# Patient Record
Sex: Female | Born: 1990 | Race: White | Hispanic: No | Smoking: Former smoker
Health system: Southern US, Community
[De-identification: ages and names within clinical notes are randomized; demographics above are authoritative.]

## PROBLEM LIST (undated history)

## (undated) ENCOUNTER — Inpatient Hospital Stay (HOSPITAL_COMMUNITY): Payer: Self-pay

## (undated) DIAGNOSIS — F32A Depression, unspecified: Secondary | ICD-10-CM

## (undated) DIAGNOSIS — G43909 Migraine, unspecified, not intractable, without status migrainosus: Secondary | ICD-10-CM

## (undated) DIAGNOSIS — G971 Other reaction to spinal and lumbar puncture: Secondary | ICD-10-CM

## (undated) DIAGNOSIS — F329 Major depressive disorder, single episode, unspecified: Secondary | ICD-10-CM

## (undated) DIAGNOSIS — O321XX0 Maternal care for breech presentation, not applicable or unspecified: Secondary | ICD-10-CM

## (undated) HISTORY — PX: ADENOIDECTOMY: SUR15

## (undated) HISTORY — PX: CERCLAGE REMOVAL: SUR1451

## (undated) HISTORY — PX: TONSILLECTOMY: SUR1361

---
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
## 2012-04-10 ENCOUNTER — Ambulatory Visit: Payer: 59 | Admitting: Family Medicine

## 2012-05-12 ENCOUNTER — Emergency Department (HOSPITAL_COMMUNITY)
Admission: EM | Admit: 2012-05-12 | Discharge: 2012-05-12 | Disposition: A | Discharge status: Home / Self Care | Payer: 59 | Attending: Emergency Medicine | Admitting: Emergency Medicine

## 2012-05-12 ENCOUNTER — Ambulatory Visit: Payer: 59 | Admitting: Family Medicine

## 2012-05-12 ENCOUNTER — Encounter (HOSPITAL_COMMUNITY): Payer: Self-pay | Admitting: Emergency Medicine

## 2012-05-12 ENCOUNTER — Emergency Department (HOSPITAL_COMMUNITY): Payer: 59

## 2012-05-12 DIAGNOSIS — R51 Headache: Secondary | ICD-10-CM

## 2012-05-12 DIAGNOSIS — F17200 Nicotine dependence, unspecified, uncomplicated: Secondary | ICD-10-CM | POA: Insufficient documentation

## 2012-05-12 DIAGNOSIS — Z79899 Other long term (current) drug therapy: Secondary | ICD-10-CM | POA: Insufficient documentation

## 2012-05-12 DIAGNOSIS — F329 Major depressive disorder, single episode, unspecified: Secondary | ICD-10-CM | POA: Insufficient documentation

## 2012-05-12 DIAGNOSIS — F3289 Other specified depressive episodes: Secondary | ICD-10-CM | POA: Insufficient documentation

## 2012-05-12 DIAGNOSIS — R1110 Vomiting, unspecified: Secondary | ICD-10-CM | POA: Insufficient documentation

## 2012-05-12 HISTORY — DX: Major depressive disorder, single episode, unspecified: F32.9

## 2012-05-12 HISTORY — DX: Depression, unspecified: F32.A

## 2012-05-12 MED ORDER — DIPHENHYDRAMINE HCL 50 MG/ML IJ SOLN
25.0000 mg | Freq: Once | INTRAMUSCULAR | Status: AC
  Administered 2012-05-12: 25 mg via INTRAVENOUS
  Filled 2012-05-12: qty 1

## 2012-05-12 MED ORDER — SODIUM CHLORIDE 0.9 % IV BOLUS (SEPSIS)
1000.0000 mL | Freq: Once | INTRAVENOUS | Status: AC
  Administered 2012-05-12: 1000 mL via INTRAVENOUS

## 2012-05-12 MED ORDER — PROMETHAZINE HCL 25 MG PO TABS: 25.0000 mg | ORAL_TABLET | Freq: Four times a day (QID) | ORAL | Status: DC | PRN

## 2012-05-12 MED ORDER — HYDROMORPHONE HCL PF 1 MG/ML IJ SOLN: 1.0000 mg | Freq: Once | INTRAMUSCULAR | Status: DC

## 2012-05-12 MED ORDER — KETOROLAC TROMETHAMINE 30 MG/ML IJ SOLN
30.0000 mg | Freq: Once | INTRAMUSCULAR | Status: AC
  Administered 2012-05-12: 30 mg via INTRAVENOUS
  Filled 2012-05-12: qty 1

## 2012-05-12 MED ORDER — METOCLOPRAMIDE HCL 5 MG/ML IJ SOLN
10.0000 mg | Freq: Once | INTRAMUSCULAR | Status: AC
  Administered 2012-05-12: 10 mg via INTRAVENOUS
  Filled 2012-05-12: qty 2

## 2012-05-12 MED ORDER — FENTANYL CITRATE 0.05 MG/ML IJ SOLN
50.0000 ug | Freq: Once | INTRAMUSCULAR | Status: AC
  Administered 2012-05-12: 50 ug via INTRAVENOUS
  Filled 2012-05-12: qty 2

## 2012-05-12 MED ORDER — ONDANSETRON HCL 4 MG/2ML IJ SOLN: 4.0000 mg | Freq: Once | INTRAMUSCULAR | Status: DC

## 2012-05-12 MED ORDER — OXYCODONE-ACETAMINOPHEN 5-325 MG PO TABS: 1.0000 | ORAL_TABLET | Freq: Four times a day (QID) | ORAL | Status: DC | PRN

## 2012-05-12 NOTE — ED Notes (Signed)
Went into patient's room to give visitor a drink, patient asleep at this time.

## 2012-05-12 NOTE — ED Notes (Signed)
Patient stated pain had come back and requested something else for pain, Dr. Adriana Simas notified.

## 2012-05-12 NOTE — ED Provider Notes (Signed)
History     CSN: [id number]  Arrival date & time 05/12/12  0346   First MD Initiated Contact with Patient 05/12/12 0404      Chief Complaint  Patient presents with  . Headache  . Emesis    (Consider location/radiation/quality/duration/timing/severity/associated sxs/prior treatment) HPI.....  right periorbital headache intermittently for one month.  Additionally patient complains of nausea vomiting and photophobia. No meningeal signs. No neuro deficits. Nothing makes symptoms better or worse. Severity is moderate   Past Medical History  Diagnosis Date  . Depression     Past Surgical History  Procedure Date  . Tonsillectomy   . Adenoidectomy   . Cesarean section   . Cerclage removal     History reviewed. No pertinent family history.  History  Substance Use Topics  . Smoking status: Current Every Day Smoker  . Smokeless tobacco: Not on file  . Alcohol Use: No    OB History    Grav Para Term Preterm Abortions TAB SAB Ect Mult Living                  Review of Systems  All other systems reviewed and are negative.    Allergies  Lamictal  Home Medications   Current Outpatient Rx  Name  Route  Sig  Dispense  Refill  . CITALOPRAM HYDROBROMIDE 20 MG PO TABS   Oral   Take 20 mg by mouth 2 (two) times daily.         Marland Kitchen FLUOXETINE HCL 20 MG PO CAPS   Oral   Take 20 mg by mouth daily.             BP 131/73  Pulse 87  Temp 98.1 F (36.7 C) (Oral)  Resp 16  Ht 5\' 7"  (1.702 m)  Wt 190 lb (86.183 kg)  BMI 29.76 kg/m2  SpO2 98%  Physical Exam  Nursing note and vitals reviewed. Constitutional: She is oriented to person, place, and time. She appears well-developed and well-nourished.  HENT:  Head: Normocephalic and atraumatic.  Eyes: Conjunctivae normal and EOM are normal. Pupils are equal, round, and reactive to light.  Neck: Normal range of motion. Neck supple.  Cardiovascular: Normal rate, regular rhythm and normal heart sounds.     Pulmonary/Chest: Effort normal and breath sounds normal.  Abdominal: Soft. Bowel sounds are normal.  Musculoskeletal: Normal range of motion.  Neurological: She is alert and oriented to person, place, and time.  Skin: Skin is warm and dry.  Psychiatric: She has a normal mood and affect.    ED Course  Procedures (including critical care time)  Labs Reviewed - No data to display No results found.   No diagnosis found.  Ct Head Wo Contrast  05/12/2012  *RADIOLOGY REPORT*  Clinical Data: Long-standing headache; nausea and vomiting. Sensitivity to light.  CT HEAD WITHOUT CONTRAST  Technique:  Contiguous axial images were obtained from the base of the skull through the vertex without contrast.  Comparison: None.  Findings: There is no evidence of acute infarction, mass lesion, or intra- or extra-axial hemorrhage on CT.  The posterior fossa, including the cerebellum, brainstem and fourth ventricle, is within normal limits.  The third and lateral ventricles, and basal ganglia are unremarkable in appearance.  The cerebral hemispheres are symmetric in appearance, with normal gray- white differentiation.  No mass effect or midline shift is seen.  There is no evidence of fracture; visualized osseous structures are unremarkable in appearance.  The visualized portions of the orbits  are within normal limits.  The paranasal sinuses and mastoid air cells are well-aerated.  No significant soft tissue abnormalities are seen.  IMPRESSION: Unremarkable noncontrast CT of the head.   Original Report Authenticated By: Tonia Ghent, M.D.     MDM   Patient feeling better after IV fluids, IV Toradol, Reglan, Benadryl.  CT head negative       Donnetta Hutching, MD 05/12/12 [phone]

## 2012-05-12 NOTE — ED Notes (Signed)
Patient reports headache that started tonight. Reports frequent headaches over the past month. Also complaining of nausea, emesis, and sensitivity to light and sound.

## 2012-05-17 ENCOUNTER — Telehealth: Payer: Self-pay | Admitting: Family Medicine

## 2012-05-17 NOTE — Telephone Encounter (Signed)
Pt's mother called and says pt is having more frequent migraines. She has a new pt apptmt on June 15, 2012, but wants to see if she can get in sooner. Is this possible? Thank you.

## 2012-05-17 NOTE — Telephone Encounter (Signed)
Rescheduled her for Monday, Nov. 18th at 2:45 p.m. Thank you.

## 2012-05-17 NOTE — Telephone Encounter (Signed)
Yes ok to see sooner- not last appt of day please.

## 2012-05-22 ENCOUNTER — Encounter: Payer: Self-pay | Admitting: Family Medicine

## 2012-05-22 ENCOUNTER — Ambulatory Visit (INDEPENDENT_AMBULATORY_CARE_PROVIDER_SITE_OTHER): Payer: 59 | Admitting: Family Medicine

## 2012-05-22 VITALS — BP 120/88 | HR 84 | Temp 97.9°F | Ht 67.0 in | Wt 212.0 lb

## 2012-05-22 DIAGNOSIS — F1921 Other psychoactive substance dependence, in remission: Secondary | ICD-10-CM | POA: Insufficient documentation

## 2012-05-22 DIAGNOSIS — Z915 Personal history of self-harm: Secondary | ICD-10-CM | POA: Insufficient documentation

## 2012-05-22 DIAGNOSIS — G43909 Migraine, unspecified, not intractable, without status migrainosus: Secondary | ICD-10-CM

## 2012-05-22 DIAGNOSIS — F411 Generalized anxiety disorder: Secondary | ICD-10-CM

## 2012-05-22 DIAGNOSIS — F419 Anxiety disorder, unspecified: Secondary | ICD-10-CM

## 2012-05-22 MED ORDER — ETONOGESTREL 68 MG ~~LOC~~ IMPL: 1.0000 | DRUG_IMPLANT | Freq: Once | SUBCUTANEOUS | Status: DC

## 2012-05-22 MED ORDER — TOPIRAMATE 25 MG PO TABS: 25.0000 mg | ORAL_TABLET | Freq: Two times a day (BID) | ORAL | Status: DC

## 2012-05-22 MED ORDER — PROMETHAZINE HCL 25 MG PO TABS: 25.0000 mg | ORAL_TABLET | Freq: Four times a day (QID) | ORAL | Status: DC | PRN

## 2012-05-22 NOTE — Patient Instructions (Addendum)
Great to see you. We are topamax 25 mg nightly- please call me a few weeks with an update.  Please stop by to see Shirlee Limerick on your way out to set up you psychiatry appointment.

## 2012-05-22 NOTE — Progress Notes (Signed)
Subjective:    Patient ID: Dana Simpson, female    DOB: 08-Dec-1990, 21 y.o.   MRN: [id number]  HPI Very pleasant 21 yo G3P2 here to establish care.  Anxiety- h/o anxiety and depression.  Did have a suicide attempt after she miscarried her child a few years ago. 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 Currently taking Celexa 20 mg twice daily which she thinks is working pretty well but still has bouts of anxiety and depression. Her boyfriend is in prison but she lives with her mom who helps care for her child. 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  She had multiple intolerances to several antidepressants and anxiolytics. 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  Does not feel depressed currently.  Migraine headaches- having at least 5 per week. Associated with photophobia, nausea and vomiting. No other neurological symptoms.  Usually unilateral but alternates sides.  Has Implanon for contraception and not currently sexually active.  Patient Active Problem List  Diagnosis  . TOBACCO ABUSE  . VAGINAL DISCHARGE  . ABORTION, SPONTANEOUS  . RH FACTOR, NEGATIVE  . PREGNANCY, ADOLESCENT  . BACK PAIN  . History of drug dependence  . Anxiety  . Migraine headache  . History of suicide attempt   Past Medical History  Diagnosis Date  . Depression    Past Surgical History  Procedure Date  . Tonsillectomy   . Adenoidectomy   . Cesarean section   . Cerclage removal    History  Substance Use Topics  . Smoking status: Current Every Day Smoker  . Smokeless tobacco: Not on file  . Alcohol Use: No   No family history on file. Allergies  Allergen Reactions  . Lamictal (Lamotrigine) Rash    Trudie Buckler syndrome    Current Outpatient Prescriptions on File Prior to Visit  Medication Sig Dispense Refill  . citalopram (CELEXA) 20 MG  tablet Take 20 mg by mouth 2 (two) times daily.      Marland Kitchen oxyCODONE-acetaminophen (PERCOCET/ROXICET) 5-325 MG per tablet Take 1-2 tablets by mouth every 6 (six) hours as needed for pain.  20 tablet  0  . [DISCONTINUED] promethazine (PHENERGAN) 25 MG tablet Take 1 tablet (25 mg total) by mouth every 6 (six) hours as needed for nausea.  20 tablet  0  . etonogestrel (IMPLANON) 68 MG IMPL implant Inject 1 each (68 mg total) into the skin once.  1 each  0  . topiramate (TOPAMAX) 25 MG tablet Take 1 tablet (25 mg total) by mouth 2 (two) times daily.  30 tablet  1   The PMH, PSH, Social History, Family History, Medications, and allergies have been reviewed in [facility], and have been updated if relevant.    Review of Systems See HPI    Objective:   Physical Exam  BP 120/88  Pulse 84  Temp 97.9 F (36.6 C)  Ht 5\' 7"  (1.702 m)  Wt 212 lb (96.163 kg)  BMI 33.20 kg/m2  General:  Well-developed,well-nourished,in no acute distress; alert,appropriate and cooperative throughout examination Neurologic:  alert & oriented X3 and gait normal.   Skin:  Intact without suspicious lesions or rashes Psych:  Cognition and judgment appear intact. Alert and cooperative with normal attention span and concentration. No apparent delusions, illusions, hallucinations       Assessment & Plan:  1. Anxiety  >25 min spent with face to face with patient, >50% counseling and/or coordinating care. Given multiple drug intolerances and allergy (severe) along with h/o substance abuse and suicide attempt, will refer to psychiatry for management. Currently feels celexa is working "pretty well."   Ambulatory referral to Psychiatry  2. Migraine headache  Deteriorated.  Start Topamax 25 mg qhs for prophylaxis.  Discussed headache journal and rebound headaches. She is aware that it is not safe to take Topamax during pregnancy but she does have implanon and is not currently sexually active.  I advised using condoms for double  protection if she does become sexually active. The patient indicates understanding of these issues and agrees with the plan.

## 2012-05-27 ENCOUNTER — Emergency Department (HOSPITAL_COMMUNITY)
Admission: EM | Admit: 2012-05-27 | Discharge: 2012-05-27 | Disposition: A | Discharge status: Home / Self Care | Payer: 59 | Attending: Emergency Medicine | Admitting: Emergency Medicine

## 2012-05-27 ENCOUNTER — Encounter (HOSPITAL_COMMUNITY): Payer: Self-pay | Admitting: *Deleted

## 2012-05-27 DIAGNOSIS — G4700 Insomnia, unspecified: Secondary | ICD-10-CM | POA: Insufficient documentation

## 2012-05-27 DIAGNOSIS — M542 Cervicalgia: Secondary | ICD-10-CM

## 2012-05-27 DIAGNOSIS — Z79899 Other long term (current) drug therapy: Secondary | ICD-10-CM | POA: Insufficient documentation

## 2012-05-27 DIAGNOSIS — R209 Unspecified disturbances of skin sensation: Secondary | ICD-10-CM | POA: Insufficient documentation

## 2012-05-27 DIAGNOSIS — F3289 Other specified depressive episodes: Secondary | ICD-10-CM | POA: Insufficient documentation

## 2012-05-27 DIAGNOSIS — F329 Major depressive disorder, single episode, unspecified: Secondary | ICD-10-CM | POA: Insufficient documentation

## 2012-05-27 DIAGNOSIS — F17200 Nicotine dependence, unspecified, uncomplicated: Secondary | ICD-10-CM | POA: Insufficient documentation

## 2012-05-27 DIAGNOSIS — R112 Nausea with vomiting, unspecified: Secondary | ICD-10-CM | POA: Insufficient documentation

## 2012-05-27 DIAGNOSIS — R0789 Other chest pain: Secondary | ICD-10-CM | POA: Insufficient documentation

## 2012-05-27 DIAGNOSIS — G43909 Migraine, unspecified, not intractable, without status migrainosus: Secondary | ICD-10-CM | POA: Insufficient documentation

## 2012-05-27 HISTORY — DX: Migraine, unspecified, not intractable, without status migrainosus: G43.909

## 2012-05-27 MED ORDER — IBUPROFEN 800 MG PO TABS
800.0000 mg | ORAL_TABLET | Freq: Once | ORAL | Status: AC
  Administered 2012-05-27: 800 mg via ORAL
  Filled 2012-05-27: qty 1

## 2012-05-27 MED ORDER — CYCLOBENZAPRINE HCL 10 MG PO TABS
10.0000 mg | ORAL_TABLET | Freq: Once | ORAL | Status: AC
  Administered 2012-05-27: 10 mg via ORAL
  Filled 2012-05-27: qty 1

## 2012-05-27 MED ORDER — IBUPROFEN 600 MG PO TABS: 600.0000 mg | ORAL_TABLET | Freq: Four times a day (QID) | ORAL | Status: DC | PRN

## 2012-05-27 MED ORDER — CYCLOBENZAPRINE HCL 10 MG PO TABS: 10.0000 mg | ORAL_TABLET | Freq: Two times a day (BID) | ORAL | Status: DC | PRN

## 2012-05-27 NOTE — ED Provider Notes (Signed)
History  This chart was scribed for Jones Skene, MD by Erskine Emery, ED Scribe. This patient was seen in room APA14/APA14 and the patient's care was started at 13:52.   CSN: [id number]  Arrival date & time 05/27/12  1337   First MD Initiated Contact with Patient 05/27/12 1352      Chief Complaint  Patient presents with  . Headache  . Numbness    (Consider location/radiation/quality/duration/timing/severity/associated sxs/prior Treatment) The history is provided by the patient. No language interpreter was used.  Dana Simpson is a 21 y.o. female who presents to the Emergency Department complaining of back of the neck pain of a 9/10 severity for weeks. Pt reports she was here a few months ago for migraines and the pain has since moved down to her neck. Pt reports some associated insomnia, pain behind the eyes, and pressure in her ears. Pt usually has some nausea and emesis during her migraine episodes, but she has not had any today. Pt reports rotating her head aggravates the pain. Pt denies any associated fevers, chills, sinus pain or sinus pressure, SOB, cough, coughing up blood, emesis, rhinorrhea, sore throat, shoulder pain, leg pain, ankle swelling, diarrhea, abdominal pain, or blood in her stools. Pt also reports some  intermittent tightening chest pain since her last visit here. This pain is worst upon waking, and usually of about a 6/10 severity. Pt had a child 4-5 months ago and she spends her days taking care of her child and not exercising.  Pt was given a CT scan during her last visit, but she has not had an MRI. She was discharged with hydrocodone and phenergan, which she took in addition to her routine Celexa. Pt was recently seen by her PCP for the same complaint and was prescribed Topamax, which mildly relieves her symptoms. Pt had a Cerclage in January, a spinal tap duiring her recent cesarian section, and a tonsillectomy several years ago. Pt has a family h/o DDD, chronic back  pain, and stroke, but nt family h/o aneurism. Pt smokes about 4-5 cigarettes/day but she doesn't drink or take any other recreational drugs.  Past Medical History  Diagnosis Date  . Depression   . Migraines     Past Surgical History  Procedure Date  . Tonsillectomy   . Adenoidectomy   . Cesarean section   . Cerclage removal     No family history on file.  History  Substance Use Topics  . Smoking status: Current Every Day Smoker  . Smokeless tobacco: Not on file  . Alcohol Use: No    OB History    Grav Para Term Preterm Abortions TAB SAB Ect Mult Living                  Review of Systems At least 10pt or greater review of systems completed and are negative except where specified in the HPI.  Allergies  Lamictal  Home Medications   Current Outpatient Rx  Name  Route  Sig  Dispense  Refill  . CITALOPRAM HYDROBROMIDE 20 MG PO TABS   Oral   Take 20 mg by mouth 2 (two) times daily.         . ETONOGESTREL 68 MG [place] IMPL   Subcutaneous   Inject 1 each (68 mg total) into the skin once.   1 each   0   . OXYCODONE-ACETAMINOPHEN 5-325 MG PO TABS   Oral   Take 1-2 tablets by mouth every 6 (six) hours as needed  for pain.   20 tablet   0   . PROMETHAZINE HCL 25 MG PO TABS   Oral   Take 1 tablet (25 mg total) by mouth every 6 (six) hours as needed for nausea.   30 tablet   0   . TOPIRAMATE 25 MG PO TABS   Oral   Take 1 tablet (25 mg total) by mouth 2 (two) times daily.   30 tablet   1     Triage Vitals: BP 120/82  Pulse 101  Temp 97.7 F (36.5 C) (Oral)  Resp 18  Ht 5\' 7"  (1.702 m)  Wt 212 lb (96.163 kg)  BMI 33.20 kg/m2  SpO2 99%  Physical Exam  PHYSICAL EXAM: VITAL SIGNS:  . Filed Vitals:   05/27/12 1341  BP: 120/82  Pulse: 101  Temp: 97.7 F (36.5 C)  TempSrc: Oral  Resp: 18  Height: 5\' 7"  (1.702 m)  Weight: 212 lb (96.163 kg)  SpO2: 99%   CONSTITUTIONAL: Awake, oriented, appears non-toxic HENT: Atraumatic, normocephalic, oral  mucosa pink and moist, airway patent. Nares patent without drainage. External ears normal. EYES: Conjunctiva clear, EOMI, PERRLA NECK: Trachea midline, non-tender, supple CARDIOVASCULAR: Normal heart rate, Normal rhythm, No murmurs, rubs, gallops PULMONARY/CHEST: Clear to auscultation, no rhonchi, wheezes, or rales. Symmetrical breath sounds. CHEST WALL: No lesions. Non-tender. ABDOMINAL: Non-distended, obese, soft, non-tender - no rebound or guarding.  BS normal. NEUROLOGIC: UJ:[id number] fields intact. PERRLA, EOMI.  Facial sensation equal to light touch bilaterally.  Good muscle bulk in the masseter muscle and good lateral movement of the jaw.  Facial expressions equal and good strength with smile/frown and puffed cheeks.  Hearing grossly intact to finger rub test.  Uvula, tongue are midline with no deviation. Symmetrical palate elevation.  Trapezius and SCM muscles are 5/5 strength bilaterally.   DTR: Brachioradialis, biceps, patellar, Achilles tendon reflexes 2+ bilaterally.  No clonus. Strength: 5/5 strength flexors and extensors in the upper and lower extremities.  Grip strength, finger adduction/abduction 5/5. Sensation: Sensation intact distally to light touch Cerebellar: No ataxia with walking or dysmetria with finger to nose, rapid alternating hand movements and heels to shin testing. EXTREMITIES: No clubbing, cyanosis, or edema SKIN: Warm, Dry, No erythema, No rash   ED Course  Procedures (including critical care time) DIAGNOSTIC STUDIES: Oxygen Saturation is 99% on room air, normal by my interpretation.    COORDINATION OF CARE: 14:05--I evaluated the patient and we discussed a treatment plan including pain medication to which the pt agreed.   14:30--Medication orders: Cyclobenzaprine (Flexeril) tablet 10 mg--once    Ibuprofen (Advil, Motrin) tablet 800 mg--once    Labs Reviewed - No data to display No results found.   1. Neck pain   2. Cervical muscle pain       MDM    Dana Simpson is a 21 y.o. female presenting with neck pain. Patient had recurrent migraine headaches and has been treated with double max for those, has had no recurrent migraine since being put on this medicine. However this week, received any new pillow top for her bed and has since had neck problems. Patient is concerned that her migraine headaches have migrated down into her neck however think this is a simple musculoskeletal problem. Treat the patient with ibuprofen and one cyclobenzaprine here, we'll reevaluate. I think her mother was concerned that the patient would need an MRI because her mother, the patient's grandmother, had a debilitating stroke at a young age. Patient has no signs or  symptoms which are concerning no focal neurologic deficits, I do not think she's got a subarachnoid hemorrhage-there is no headache present, there was a prior CT done of her head showing no mass effect, no hydrocephalus.  After treatment, patient's pain is nonexistent, given her some exercises to perform at home, neck circles, we'll prescribe ibuprofen for her as well as cyclobenzaprine to use as needed. Have also advised her to take the pillow top her off of her bed.  I explained the diagnosis and have given explicit precautions to return to the ER including numbness, tingling, weakness in the upper extremities or any other new or worsening symptoms. The patient understands and accepts the medical plan as it's been dictated and I have answered their questions. Discharge instructions concerning home care and prescriptions have been given.  The patient is STABLE and is discharged to home in good condition.       I personally performed the services described in this documentation, which was scribed in my presence. The recorded information has been reviewed and is accurate. Jones Skene, M.D.     Jones Skene, MD 05/27/12 1738

## 2012-05-27 NOTE — ED Notes (Signed)
Ongoing headaches since last visit. Currently seeing PMD for same. Pt states pain has moved down to back of her neck. Bilateral ear pain. Mother states pt was unable to complete a sentence last night and c/o right sided numbness.

## 2012-06-15 ENCOUNTER — Ambulatory Visit: Payer: 59 | Admitting: Family Medicine

## 2012-07-03 ENCOUNTER — Ambulatory Visit: Payer: 59 | Admitting: Family Medicine

## 2012-07-07 ENCOUNTER — Encounter: Payer: Self-pay | Admitting: Family Medicine

## 2012-07-07 ENCOUNTER — Ambulatory Visit (INDEPENDENT_AMBULATORY_CARE_PROVIDER_SITE_OTHER): Payer: 59 | Admitting: Family Medicine

## 2012-07-07 VITALS — BP 120/70 | HR 84 | Temp 98.1°F | Wt 221.0 lb

## 2012-07-07 DIAGNOSIS — Z23 Encounter for immunization: Secondary | ICD-10-CM

## 2012-07-07 DIAGNOSIS — Z915 Personal history of self-harm: Secondary | ICD-10-CM

## 2012-07-07 DIAGNOSIS — Z8659 Personal history of other mental and behavioral disorders: Secondary | ICD-10-CM

## 2012-07-07 DIAGNOSIS — F419 Anxiety disorder, unspecified: Secondary | ICD-10-CM

## 2012-07-07 DIAGNOSIS — F411 Generalized anxiety disorder: Secondary | ICD-10-CM

## 2012-07-07 MED ORDER — ALPRAZOLAM 0.25 MG PO TABS: 0.2500 mg | ORAL_TABLET | Freq: Three times a day (TID) | ORAL | Status: DC | PRN

## 2012-07-07 NOTE — Progress Notes (Signed)
Subjective:    Patient ID: Dana Simpson, female    DOB: Jul 01, 1991, 22 y.o.   MRN: [id number]  HPI Very pleasant 22 yo here to discuss anxiety.  Anxiety- h/o anxiety and depression.    Did have a suicide attempt after she miscarried her child a few years ago.  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  Currently taking Celexa 20 mg twice daily which she thinks is working pretty well but still has bouts of anxiety and depression. Her boyfriend is in prison but she lives with her mom who helps care for her child. 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  She is here with her friend today who is very supportive and willing to help out with her baby.  She had multiple intolerances to several antidepressants and anxiolytics. 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  When she established care with me in 05/2012, I referred her to psychiatry for management given her multiple drug intolerances and allergy (severe) along with h/o substance abuse and suicide attempt. She tells me today that when she called around, she could not afford to pay for a psychiatrist. .  Patient Active Problem List  Diagnosis  . TOBACCO ABUSE  . VAGINAL DISCHARGE  . ABORTION, SPONTANEOUS  . RH FACTOR, NEGATIVE  . PREGNANCY, ADOLESCENT  . BACK PAIN  . History of drug dependence  . Anxiety  . Migraine headache  . History of suicide attempt   Past Medical History  Diagnosis Date  . Depression   . Migraines    Past Surgical History  Procedure Date  . Tonsillectomy   . Adenoidectomy   . Cesarean section   . Cerclage removal    History  Substance Use Topics  . Smoking status: Current Every Day Smoker  . Smokeless tobacco: Not on file  . Alcohol Use: No   No family history on file. Allergies  Allergen Reactions  .  Lamictal (Lamotrigine) Rash    Trudie Buckler syndrome    Current Outpatient Prescriptions on File Prior to Visit  Medication Sig Dispense Refill  . citalopram (CELEXA) 20 MG tablet Take 20 mg by mouth 2 (two) times daily.      . cyclobenzaprine (FLEXERIL) 10 MG tablet Take 1 tablet (10 mg total) by mouth 2 (two) times daily as needed for muscle spasms.  20 tablet  0  . etonogestrel (IMPLANON) 68 MG IMPL implant Inject 1 each (68 mg total) into the skin once.  1 each  0  . ibuprofen (ADVIL,MOTRIN) 600 MG tablet Take 1 tablet (600 mg total) by mouth every 6 (six) hours as needed for pain.  30 tablet  0  . oxyCODONE-acetaminophen (PERCOCET/ROXICET) 5-325 MG per tablet Take 1-2 tablets by mouth every 6 (six) hours as needed for pain.  20 tablet  0  . promethazine (PHENERGAN) 25 MG tablet Take 1 tablet (25 mg total) by mouth every 6 (six) hours as needed for nausea.  30 tablet  0  . topiramate (TOPAMAX) 25 MG tablet Take 1 tablet (25 mg total) by mouth 2 (two) times daily.  30 tablet  1   The PMH, PSH, Social History, Family History, Medications, and allergies have been reviewed in [facility], and have been updated if relevant.    Review of  Systems See HPI    Objective:   Physical Exam BP 120/70  Pulse 84  Temp 98.1 F (36.7 C)  Wt 221 lb (100.245 kg)  General:  Well-developed,well-nourished,in no acute distress; alert,appropriate and cooperative throughout examination Neurologic:  alert & oriented X3 and gait normal.   Skin:  Intact without suspicious lesions or rashes Psych:  Cognition and judgment appear intact. Alert and cooperative with normal attention span and concentration. No apparent delusions, illusions, hallucinations       Assessment & Plan:   1. Anxiety  >25 min spent with face to face with patient, >50% counseling and/or coordinating care. She asks for xanax today, which I denied due to her history of addiction.  Will refer to Dr. Laymond Purser for psychotherapy as hopefully  helping her come up with a plan that will allow her to cope with the stress and get out of the house to do things will be more beneficial than medication.  Continue celexa.   The patient indicates understanding of these issues and agrees with the plan.    Orders Placed This Encounter  Procedures  . Flu vaccine greater than or equal to 3yo preservative free IM  . Ambulatory referral to Psychology

## 2012-07-07 NOTE — Patient Instructions (Addendum)
Good to see you. Please stop by to see [name] on your way out to set up your appointment with Dr. Perrin. 

## 2012-07-12 ENCOUNTER — Ambulatory Visit: Payer: 59 | Admitting: Psychology

## 2012-07-18 ENCOUNTER — Other Ambulatory Visit: Payer: Self-pay | Admitting: Family Medicine

## 2012-07-19 ENCOUNTER — Other Ambulatory Visit: Payer: Self-pay | Admitting: Family Medicine

## 2012-07-19 NOTE — Telephone Encounter (Signed)
Toniann Fail pts mother called to ck status of topamax refill.spoke with Meisha at [facility] and med ready for pick up. Toniann Fail notified.

## 2012-08-11 ENCOUNTER — Encounter: Payer: Self-pay | Admitting: Family Medicine

## 2012-08-11 ENCOUNTER — Ambulatory Visit (INDEPENDENT_AMBULATORY_CARE_PROVIDER_SITE_OTHER): Payer: 59 | Admitting: Family Medicine

## 2012-08-11 VITALS — BP 110/68 | HR 113 | Temp 98.8°F | Wt 219.0 lb

## 2012-08-11 DIAGNOSIS — F411 Generalized anxiety disorder: Secondary | ICD-10-CM

## 2012-08-11 DIAGNOSIS — L309 Dermatitis, unspecified: Secondary | ICD-10-CM | POA: Insufficient documentation

## 2012-08-11 DIAGNOSIS — F419 Anxiety disorder, unspecified: Secondary | ICD-10-CM

## 2012-08-11 DIAGNOSIS — L259 Unspecified contact dermatitis, unspecified cause: Secondary | ICD-10-CM

## 2012-08-11 MED ORDER — ZOLPIDEM TARTRATE 5 MG PO TABS: 5.0000 mg | ORAL_TABLET | Freq: Every evening | ORAL | Status: DC | PRN

## 2012-08-11 MED ORDER — TOPIRAMATE 25 MG PO TABS: ORAL_TABLET | ORAL | Status: DC

## 2012-08-11 MED ORDER — FLUOCINONIDE-E 0.05 % EX CREA: TOPICAL_CREAM | Freq: Two times a day (BID) | CUTANEOUS | Status: DC

## 2012-08-11 MED ORDER — PROMETHAZINE HCL 25 MG PO TABS: 25.0000 mg | ORAL_TABLET | Freq: Four times a day (QID) | ORAL | Status: DC | PRN

## 2012-08-11 NOTE — Progress Notes (Signed)
Subjective:    Patient ID: Dana Simpson, female    DOB: 11-Apr-1991, 22 y.o.   MRN: [id number]  HPI Very pleasant 22 yo female here with her mom to follow up anxiety.  Anxiety- h/o anxiety and depression.    Did have a suicide attempt after she miscarried her child a few years ago.  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  Currently taking Celexa 20 mg twice daily which she thinks is working pretty well but still has bouts of anxiety and depression.  I also referred her to see Dr. Laymond Purser last month.  She has not seen her yet- pt had to reschedule multiple times.  Her boyfriend is in prison but she lives with her mom who helps care for her child. 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  She had multiple intolerances to several antidepressants and anxiolytics. 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  When she established care with me in 05/2012, I referred her to psychiatry for management given her multiple drug intolerances and allergy (severe) along with h/o substance abuse and suicide attempt. She never made that appointment.  She is having panic attacks at night and cannot sleep.  Also has had dry skin on her knuckles for past several weeks. .  Patient Active Problem List  Diagnosis  . TOBACCO ABUSE  . VAGINAL DISCHARGE  . ABORTION, SPONTANEOUS  . RH FACTOR, NEGATIVE  . PREGNANCY, ADOLESCENT  . BACK PAIN  . History of drug dependence  . Anxiety  . Migraine headache  . History of suicide attempt   Past Medical History  Diagnosis Date  . Depression   . Migraines    Past Surgical History  Procedure Date  . Tonsillectomy   . Adenoidectomy   . Cesarean section   . Cerclage removal    History  Substance Use Topics  . Smoking status: Current Every Day Smoker  . Smokeless tobacco:  Not on file  . Alcohol Use: No   No family history on file. Allergies  Allergen Reactions  . Lamictal (Lamotrigine) Rash    Trudie Buckler syndrome    Current Outpatient Prescriptions on File Prior to Visit  Medication Sig Dispense Refill  . citalopram (CELEXA) 20 MG tablet Take 20 mg by mouth 2 (two) times daily.      Marland Kitchen etonogestrel (IMPLANON) 68 MG IMPL implant Inject 1 each (68 mg total) into the skin once.  1 each  0  . ibuprofen (ADVIL,MOTRIN) 600 MG tablet Take 1 tablet (600 mg total) by mouth every 6 (six) hours as needed for pain.  30 tablet  0  . promethazine (PHENERGAN) 25 MG tablet Take 1 tablet (25 mg total) by mouth every 6 (six) hours as needed for nausea.  30 tablet  0  . topiramate (TOPAMAX) 25 MG tablet TAKE ONE TABLET BY MOUTH TWICE DAILY  30 tablet  0  . topiramate (TOPAMAX) 25 MG tablet TAKE ONE TABLET BY MOUTH TWICE DAILY  30 tablet  0   The PMH, PSH, Social History, Family History, Medications, and allergies have been reviewed in [facility], and have been updated if relevant.    Review of Systems See HPI    Objective:   Physical Exam BP 110/68  Pulse 113  Temp 98.8 F (  37.1 C)  Wt 219 lb (99.338 kg)  SpO2 96%  General:  Well-developed,well-nourished,in no acute distress; alert,appropriate and cooperative throughout examination Neurologic:  alert & oriented X3 and gait normal.   Skin:  Dry erythematous skin on knuckles Psych:  Cognition and judgment appear intact. Alert and cooperative with normal attention span and concentration. No apparent delusions, illusions, hallucinations       Assessment & Plan:   1. Anxiety  >25 min spent with face to face with patient, >50% counseling and/or coordinating care. She asks for xanax again today, which I denied due to her history of addiction.  I strongly urged her to keep her appointment with Dr. Laymond Purser and to see a psychiatrist to manage her medication given her multiple allergies and intolerances. Continue celexa.    The patient indicates understanding of these issues and agrees with the plan.        2. Eczema    New- lidex twice daily as needed.  Keep skin moisturized.

## 2012-08-11 NOTE — Patient Instructions (Addendum)
Good to see you. Please take ambien as directed.  Call the psychiatrist like we discussed today and keep me update.

## 2012-08-15 ENCOUNTER — Ambulatory Visit: Payer: 59 | Admitting: Psychology

## 2012-09-06 ENCOUNTER — Encounter: Payer: Self-pay | Admitting: Family Medicine

## 2012-09-06 ENCOUNTER — Ambulatory Visit (INDEPENDENT_AMBULATORY_CARE_PROVIDER_SITE_OTHER): Payer: 59 | Admitting: Family Medicine

## 2012-09-06 VITALS — BP 100/66 | HR 95 | Temp 99.2°F | Ht 67.0 in | Wt 222.8 lb

## 2012-09-06 DIAGNOSIS — R079 Chest pain, unspecified: Secondary | ICD-10-CM

## 2012-09-06 DIAGNOSIS — J209 Acute bronchitis, unspecified: Secondary | ICD-10-CM

## 2012-09-06 MED ORDER — AZITHROMYCIN 250 MG PO TABS: ORAL_TABLET | ORAL | Status: DC

## 2012-09-06 MED ORDER — ONDANSETRON HCL 4 MG PO TABS: 4.0000 mg | ORAL_TABLET | Freq: Three times a day (TID) | ORAL | Status: DC | PRN

## 2012-09-06 NOTE — Patient Instructions (Addendum)
Take the zithromax as directed and think about quitting smoking  Try mucinex DM - for cough and congestion  If you start wheezing- let us know  Drink lots of fluid and get extra rest  Update if not starting to improve in a week or if worsening

## 2012-09-06 NOTE — Progress Notes (Signed)
Subjective:    Patient ID: Dana Simpson, female    DOB: 09/07/1990, 22 y.o.   MRN: [id number]  HPI Here with uri symptoms - since last Sunday  Daughter gave it to her  Runny and stuffy nose and scratchy throat  ? If fever at home - has 99.2 now Has felt very hot - and achey yesterday Did get a flu shot   Is a smoker No copd known and does not wheeze  She wants to quit    Also some chest pains going on for "weeks"  Is sharp and up in R side - and occ radiates to her arm  Is constant  Worse when she picks up the baby or lies on that side It also may be stress related -- also has panic attacks (a little better)  Not exertional at all- happens at rest   Patient Active Problem List  Diagnosis  . TOBACCO ABUSE  . VAGINAL DISCHARGE  . ABORTION, SPONTANEOUS  . RH FACTOR, NEGATIVE  . PREGNANCY, ADOLESCENT  . BACK PAIN  . History of drug dependence  . Anxiety  . Migraine headache  . History of suicide attempt  . Eczema   Past Medical History  Diagnosis Date  . Depression   . Migraines    Past Surgical History  Procedure Laterality Date  . Tonsillectomy    . Adenoidectomy    . Cesarean section    . Cerclage removal     History  Substance Use Topics  . Smoking status: Current Every Day Smoker -- 0.25 packs/day    Types: Cigarettes  . Smokeless tobacco: Not on file  . Alcohol Use: No   No family history on file. Allergies  Allergen Reactions  . Lamictal (Lamotrigine) Rash    Trudie Buckler syndrome    Current Outpatient Prescriptions on File Prior to Visit  Medication Sig Dispense Refill  . citalopram (CELEXA) 20 MG tablet Take 20 mg by mouth 2 (two) times daily.      Marland Kitchen etonogestrel (IMPLANON) 68 MG IMPL implant Inject 1 each (68 mg total) into the skin once.  1 each  0  . fluocinonide-emollient (LIDEX-E) 0.05 % cream Apply topically 2 (two) times daily.  30 g  0  . ibuprofen (ADVIL,MOTRIN) 600 MG tablet Take 1 tablet (600 mg total) by mouth every 6 (six)  hours as needed for pain.  30 tablet  0  . promethazine (PHENERGAN) 25 MG tablet Take 1 tablet (25 mg total) by mouth every 6 (six) hours as needed for nausea.  30 tablet  0  . topiramate (TOPAMAX) 25 MG tablet TAKE ONE TABLET BY MOUTH TWICE DAILY  30 tablet  0  . zolpidem (AMBIEN) 5 MG tablet Take 1 tablet (5 mg total) by mouth at bedtime as needed for sleep.  15 tablet  1   No current facility-administered medications on file prior to visit.       Review of Systems Review of Systems  Constitutional: Negative for appetite change,  and unexpected weight change.  ENT pos for cong and rhinorrhea and neg for facial pain  Eyes: Negative for pain and visual disturbance.  Respiratory: Negative for shortness of breath.   Cardiovascular: Negative for palpitations   neg for PND or orthopnea or exertional cp Gastrointestinal: Negative for  diarrhea and constipation. pos for nausea from post nasal drip (requests zofran for this) Genitourinary: Negative for urgency and frequency.  Skin: Negative for pallor or rash   Neurological: Negative for  weakness, light-headedness, numbness and headaches.  Hematological: Negative for adenopathy. Does not bruise/bleed easily.  Psychiatric/Behavioral: pos for dysphoric mood and anxiety        Objective:   Physical Exam  Constitutional: She appears well-developed and well-nourished. No distress.  Obese fatigued appearing female who smells strongly of smoke  HENT:  Head: Normocephalic and atraumatic.  Right Ear: External ear normal.  Left Ear: External ear normal.  Mouth/Throat: Oropharynx is clear and moist. No oropharyngeal exudate.  Nares are injected and congested  No facial tenderness Throat- clear post nasal drip  Eyes: Conjunctivae are normal. Pupils are equal, round, and reactive to light. Left eye exhibits no discharge.  Neck: Normal range of motion. Neck supple. No JVD present. No thyromegaly present.  Cardiovascular: Normal rate and intact  distal pulses.  Exam reveals no gallop.   Pulmonary/Chest: Effort normal and breath sounds normal. No respiratory distress. She has no wheezes. She has no rales. She exhibits no tenderness.  Harsh and distant bs Few rhonchi  Abdominal: Soft. Bowel sounds are normal. She exhibits no distension and no mass. There is no tenderness.  Musculoskeletal: She exhibits no edema and no tenderness.  No swelling or palp cords Neg homans sign  Lymphadenopathy:    She has no cervical adenopathy.  Neurological: She is alert. She has normal reflexes.  Skin: Skin is warm and dry. No rash noted. No pallor.  Psychiatric: Her affect is blunt. She is slowed and withdrawn. She exhibits a depressed mood.          Assessment & Plan:

## 2012-09-07 NOTE — Assessment & Plan Note (Signed)
In smoker with prod cough and fever  Cover with zithromax inst to quit smoking Disc symptomatic care - see instructions on AVS  Update if not starting to improve in a week or if worsening

## 2012-09-07 NOTE — Assessment & Plan Note (Signed)
Atypical and with nl EKG today  This may be related to her bronchitis or more likely her anxiety  Disc red flags to watch for ie: exertional symptoms or sob  F/u with PCP if not improved

## 2012-09-27 ENCOUNTER — Other Ambulatory Visit: Payer: Self-pay | Admitting: Family Medicine

## 2012-09-28 NOTE — Telephone Encounter (Signed)
Ambien called to [facility].

## 2012-10-02 ENCOUNTER — Encounter: Payer: Self-pay | Admitting: Family Medicine

## 2012-10-02 ENCOUNTER — Ambulatory Visit (INDEPENDENT_AMBULATORY_CARE_PROVIDER_SITE_OTHER): Payer: 59 | Admitting: Family Medicine

## 2012-10-02 VITALS — BP 100/64 | HR 80 | Temp 98.1°F | Wt 229.0 lb

## 2012-10-02 DIAGNOSIS — F329 Major depressive disorder, single episode, unspecified: Secondary | ICD-10-CM | POA: Insufficient documentation

## 2012-10-02 DIAGNOSIS — R5381 Other malaise: Secondary | ICD-10-CM

## 2012-10-02 DIAGNOSIS — G43909 Migraine, unspecified, not intractable, without status migrainosus: Secondary | ICD-10-CM

## 2012-10-02 MED ORDER — TOPIRAMATE 50 MG PO TABS: 50.0000 mg | ORAL_TABLET | Freq: Every day | ORAL | Status: DC

## 2012-10-02 MED ORDER — SERTRALINE HCL 50 MG PO TABS: 50.0000 mg | ORAL_TABLET | Freq: Every day | ORAL | Status: DC

## 2012-10-02 NOTE — Patient Instructions (Addendum)
To wean off celexa- 1 tablet daily for 1 week, then 1/2 tablet every other for 1 week. You can go ahead and start the zoloft 50 mg daily.  We will call you with your lab results.

## 2012-10-02 NOTE — Progress Notes (Signed)
Subjective:    Patient ID: Dana Simpson, female    DOB: 14-Jul-1990, 22 y.o.   MRN: [id number]  HPI Very pleasant 22 yo G3P2 here to discuss:  Anxiety- h/o anxiety and depression.  Did have a suicide attempt after she miscarried her child a few years ago. 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 Currently taking Celexa 20 mg twice daily which she thiought was working pretty well but still has bouts of anxiety and depression. Her boyfriend is in prison but she lives with her mom who helps care for her child. 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 She wants to try another antidepressants.  Previously on Prozac and it "stopped working.""  She had multiple intolerances to several antidepressants and anxiolytics. 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  I have referred her to psychiatry multiple times but she cannot afford to go.  I have suggested behavorial health but she states she cannot afford this either.  She is also very tired even if she gets a full night sleep.   Migraine headaches- initially improved on Topamax 25 mg daily.   Now getting them more frequently.  Having 1 per week Associated with photophobia, nausea and vomiting. No other neurological symptoms.  Usually unilateral.  Has Implanon for contraception and not currently sexually active.  Patient Active Problem List  Diagnosis  . TOBACCO ABUSE  . VAGINAL DISCHARGE  . ABORTION, SPONTANEOUS  . RH FACTOR, NEGATIVE  . PREGNANCY, ADOLESCENT  . BACK PAIN  . History of drug dependence  . Anxiety  . Migraine headache  . History of suicide attempt  . Eczema  . Chest pain  . Acute bronchitis  . Other malaise and fatigue   Past Medical History  Diagnosis Date  . Depression   . Migraines    Past Surgical History  Procedure Laterality Date  . Tonsillectomy    .  Adenoidectomy    . Cesarean section    . Cerclage removal     History  Substance Use Topics  . Smoking status: Current Every Day Smoker -- 0.25 packs/day    Types: Cigarettes  . Smokeless tobacco: Not on file  . Alcohol Use: No   No family history on file. Allergies  Allergen Reactions  . Lamictal (Lamotrigine) Rash    Trudie Buckler syndrome    Current Outpatient Prescriptions on File Prior to Visit  Medication Sig Dispense Refill  . citalopram (CELEXA) 20 MG tablet Take 20 mg by mouth 2 (two) times daily.      Marland Kitchen etonogestrel (IMPLANON) 68 MG IMPL implant Inject 1 each (68 mg total) into the skin once.  1 each  0  . fluocinonide-emollient (LIDEX-E) 0.05 % cream Apply topically 2 (two) times daily.  30 g  0  . ibuprofen (ADVIL,MOTRIN) 600 MG tablet Take 1 tablet (600 mg total) by mouth every 6 (six) hours as needed for pain.  30 tablet  0  . ondansetron (ZOFRAN) 4 MG tablet Take 1 tablet (4 mg total) by mouth every 8 (eight) hours as needed for nausea.  12 tablet  0  . promethazine (PHENERGAN) 25 MG tablet TAKE ONE TABLET BY MOUTH EVERY 6 HOURS AS NEEDED FOR NAUSEA  30 tablet  0  . topiramate (TOPAMAX) 25 MG tablet TAKE ONE TABLET BY MOUTH TWICE DAILY  30  tablet  0  . zolpidem (AMBIEN) 5 MG tablet TAKE ONE TABLET BY MOUTH AT BEDTIME AS NEEDED FOR SLEEP  15 tablet  0   No current facility-administered medications on file prior to visit.   The PMH, PSH, Social History, Family History, Medications, and allergies have been reviewed in [facility], and have been updated if relevant.    Review of Systems See HPI   Denies any heat or cold intolerance. No constipation or diarrhea  Objective:   Physical Exam  BP 100/64  Pulse 80  Temp(Src) 98.1 F (36.7 C)  Wt 229 lb (103.874 kg)  BMI 35.86 kg/m2  General:  Well-developed,well-nourished,in no acute distress; alert,appropriate and cooperative throughout examination Neurologic:  alert & oriented X3 and gait normal.   Skin:  Intact  without suspicious lesions or rashes Psych:  Cognition and judgment appear intact. Alert and cooperative with normal attention span and concentration. No apparent delusions, illusions, hallucinations       Assessment & Plan:  1. Migraine headache Deteriorated.  Initially had great response to topamax. Will increase it to 50 mg qhs. The patient indicates understanding of these issues and agrees with the plan.   2. Other malaise and fatigue Likely due to her depression but will check labs to rule out any other contributing factors. - CBC with Differential - TSH - T4, Free - Vitamin D, 25-hydroxy  3. Depression Deteriorated.  I would still like for her to be managed by psychiatry but she will not pursue this. Will wean off celexa and start zoloft 50 mg daily. Follow up in 3 weeks. The patient indicates understanding of these issues and agrees with the plan.

## 2012-10-03 LAB — CBC WITH DIFFERENTIAL/PLATELET
Basophils Relative: 1.1 % (ref 0.0–3.0)
Eosinophils Absolute: 0.1 10*3/uL (ref 0.0–0.7)
Eosinophils Relative: 0.5 % (ref 0.0–5.0)
HCT: 45.2 % (ref 36.0–46.0)
Lymphs Abs: 3.5 10*3/uL (ref 0.7–4.0)
MCHC: 34 g/dL (ref 30.0–36.0)
MCV: 86 fl (ref 78.0–100.0)
Monocytes Absolute: 0.6 10*3/uL (ref 0.1–1.0)
Neutro Abs: 6.7 10*3/uL (ref 1.4–7.7)
Neutrophils Relative %: 60.8 % (ref 43.0–77.0)
RBC: 5.26 Mil/uL — ABNORMAL HIGH (ref 3.87–5.11)
WBC: 11 10*3/uL — ABNORMAL HIGH (ref 4.5–10.5)

## 2012-10-03 MED ORDER — ERGOCALCIFEROL 1.25 MG (50000 UT) PO CAPS: ORAL_CAPSULE | ORAL | Status: DC

## 2012-10-03 NOTE — Addendum Note (Signed)
Addended by: Eliezer Bottom on: 10/03/2012 03:17 PM   Modules accepted: Orders

## 2012-10-20 ENCOUNTER — Other Ambulatory Visit: Payer: Self-pay | Admitting: Family Medicine

## 2012-10-23 ENCOUNTER — Other Ambulatory Visit: Payer: Self-pay | Admitting: Family Medicine

## 2012-10-23 NOTE — Telephone Encounter (Signed)
Medicine called to [facility]. 

## 2012-10-27 ENCOUNTER — Encounter (HOSPITAL_COMMUNITY): Payer: Self-pay | Admitting: *Deleted

## 2012-10-27 ENCOUNTER — Emergency Department (HOSPITAL_COMMUNITY)
Admission: EM | Admit: 2012-10-27 | Discharge: 2012-10-27 | Disposition: A | Discharge status: Home / Self Care | Payer: 59 | Attending: Emergency Medicine | Admitting: Emergency Medicine

## 2012-10-27 DIAGNOSIS — Z79899 Other long term (current) drug therapy: Secondary | ICD-10-CM | POA: Insufficient documentation

## 2012-10-27 DIAGNOSIS — R42 Dizziness and giddiness: Secondary | ICD-10-CM | POA: Insufficient documentation

## 2012-10-27 DIAGNOSIS — F3289 Other specified depressive episodes: Secondary | ICD-10-CM | POA: Insufficient documentation

## 2012-10-27 DIAGNOSIS — R0602 Shortness of breath: Secondary | ICD-10-CM | POA: Insufficient documentation

## 2012-10-27 DIAGNOSIS — F329 Major depressive disorder, single episode, unspecified: Secondary | ICD-10-CM | POA: Insufficient documentation

## 2012-10-27 DIAGNOSIS — R197 Diarrhea, unspecified: Secondary | ICD-10-CM | POA: Insufficient documentation

## 2012-10-27 DIAGNOSIS — F17200 Nicotine dependence, unspecified, uncomplicated: Secondary | ICD-10-CM | POA: Insufficient documentation

## 2012-10-27 DIAGNOSIS — R509 Fever, unspecified: Secondary | ICD-10-CM | POA: Insufficient documentation

## 2012-10-27 DIAGNOSIS — Z3202 Encounter for pregnancy test, result negative: Secondary | ICD-10-CM | POA: Insufficient documentation

## 2012-10-27 DIAGNOSIS — Z8679 Personal history of other diseases of the circulatory system: Secondary | ICD-10-CM | POA: Insufficient documentation

## 2012-10-27 DIAGNOSIS — R1013 Epigastric pain: Secondary | ICD-10-CM | POA: Insufficient documentation

## 2012-10-27 DIAGNOSIS — K529 Noninfective gastroenteritis and colitis, unspecified: Secondary | ICD-10-CM

## 2012-10-27 DIAGNOSIS — J3489 Other specified disorders of nose and nasal sinuses: Secondary | ICD-10-CM | POA: Insufficient documentation

## 2012-10-27 DIAGNOSIS — R51 Headache: Secondary | ICD-10-CM | POA: Insufficient documentation

## 2012-10-27 DIAGNOSIS — K5289 Other specified noninfective gastroenteritis and colitis: Secondary | ICD-10-CM | POA: Insufficient documentation

## 2012-10-27 DIAGNOSIS — R079 Chest pain, unspecified: Secondary | ICD-10-CM | POA: Insufficient documentation

## 2012-10-27 LAB — BASIC METABOLIC PANEL
BUN: 13 mg/dL (ref 6–23)
CO2: 20 mEq/L (ref 19–32)
Calcium: 9.3 mg/dL (ref 8.4–10.5)
GFR calc non Af Amer: 81 mL/min — ABNORMAL LOW (ref >90)
Glucose, Bld: 110 mg/dL — ABNORMAL HIGH (ref 70–99)

## 2012-10-27 LAB — CBC WITH DIFFERENTIAL/PLATELET
Eosinophils Absolute: 0 10*3/uL (ref 0.0–0.7)
Eosinophils Relative: 0 % (ref 0–5)
HCT: 45.8 % (ref 36.0–46.0)
Hemoglobin: 16.4 g/dL — ABNORMAL HIGH (ref 12.0–15.0)
Lymphocytes Relative: 7 % — ABNORMAL LOW (ref 12–46)
Lymphs Abs: 1.2 10*3/uL (ref 0.7–4.0)
MCH: 29.9 pg (ref 26.0–34.0)
MCV: 83.4 fL (ref 78.0–100.0)
Monocytes Absolute: 0.9 10*3/uL (ref 0.1–1.0)
Monocytes Relative: 5 % (ref 3–12)
RBC: 5.49 MIL/uL — ABNORMAL HIGH (ref 3.87–5.11)
WBC: 17.2 10*3/uL — ABNORMAL HIGH (ref 4.0–10.5)

## 2012-10-27 LAB — URINALYSIS, ROUTINE W REFLEX MICROSCOPIC
Glucose, UA: NEGATIVE mg/dL
Leukocytes, UA: NEGATIVE
pH: 5.5 (ref 5.0–8.0)

## 2012-10-27 LAB — URINE MICROSCOPIC-ADD ON

## 2012-10-27 LAB — PREGNANCY, URINE: Preg Test, Ur: NEGATIVE

## 2012-10-27 MED ORDER — ONDANSETRON 4 MG PO TBDP: 4.0000 mg | ORAL_TABLET | Freq: Three times a day (TID) | ORAL | Status: DC | PRN

## 2012-10-27 MED ORDER — ONDANSETRON HCL 4 MG/2ML IJ SOLN
4.0000 mg | Freq: Once | INTRAMUSCULAR | Status: AC
  Administered 2012-10-27: 4 mg via INTRAVENOUS
  Filled 2012-10-27: qty 2

## 2012-10-27 MED ORDER — SODIUM CHLORIDE 0.9 % IV BOLUS (SEPSIS)
1000.0000 mL | Freq: Once | INTRAVENOUS | Status: AC
  Administered 2012-10-27: 1000 mL via INTRAVENOUS

## 2012-10-27 MED ORDER — LOPERAMIDE HCL 2 MG PO CAPS: 2.0000 mg | ORAL_CAPSULE | Freq: Four times a day (QID) | ORAL | Status: DC | PRN

## 2012-10-27 MED ORDER — HYDROMORPHONE HCL PF 1 MG/ML IJ SOLN
1.0000 mg | Freq: Once | INTRAMUSCULAR | Status: AC
  Administered 2012-10-27: 1 mg via INTRAVENOUS
  Filled 2012-10-27: qty 1

## 2012-10-27 MED ORDER — SODIUM CHLORIDE 0.9 % IV SOLN
INTRAVENOUS | Status: DC
  Administered 2012-10-27: 10:00:00 via INTRAVENOUS

## 2012-10-27 MED ORDER — PROMETHAZINE HCL 25 MG PO TABS: 25.0000 mg | ORAL_TABLET | Freq: Four times a day (QID) | ORAL | Status: DC | PRN

## 2012-10-27 NOTE — ED Notes (Signed)
Left in c/o mother for transport home; instructions, prescriptions and f/u information given/reviewed - verbalizes understanding.  

## 2012-10-27 NOTE — ED Notes (Signed)
C/o n/v/d onset 1900 last night; c/o lower back pain.

## 2012-10-27 NOTE — ED Notes (Signed)
N/V/D began at 1930 last night. Last vomited at 0300. Pt also states lower back pain. Abdominal cramping at times.

## 2012-10-27 NOTE — ED Provider Notes (Signed)
History  This chart was scribed for Shelda Jakes, MD by Dana Simpson, ED Scribe. The patient was seen in room APA07/APA07. Patient's care was started at 0904.   CSN: [id number]  Arrival date & time 10/27/12  4098   First MD Initiated Contact with Patient 10/27/12 [id number]      Chief Complaint  Patient presents with  . Emesis  . Diarrhea     Patient is a 22 y.o. female presenting with diarrhea. The history is provided by the patient and a parent. No language interpreter was used.  Diarrhea Quality:  Watery Severity:  Severe Onset quality:  Sudden Number of episodes:  12-13 Duration:  14 hours Timing:  Intermittent Progression:  Unchanged Relieved by:  Nothing Worsened by:  Nothing tried Ineffective treatments:  None tried Associated symptoms: abdominal pain, chills, fever, headaches and vomiting   Vomiting:    Quality:  Stomach contents   Number of occurrences:  12-13   Severity:  Severe   Duration:  14 hours   Timing:  Intermittent   Progression:  Unchanged   Dana Simpson is a 22 y.o. female who presents to the Emergency Department complaining of severe, intermittent, vomiting and diarrhea onset 14 hours ago. She reports vomiting and diarrhea 12-13 times since onset. Pt reports associated epigastric abdominal pain, fever (ED temp is 98.9), chills, CP, HA, dizziness and SOB. Pt states that she had eaten at a restaurant last night and feels as if she has food poisoning but other relative ate at restaurant with her and did not ahve similar symptoms. She reports her last episode of vomiting was 3 am this morning.  LNMP was the first week of April. She denies blood in stool, hematemesis, rash, and any other symptoms    PCP- Dr. Dayton Martes of [facility]  Past Medical History  Diagnosis Date  . Depression   . Migraines     Past Surgical History  Procedure Laterality Date  . Tonsillectomy    . Adenoidectomy    . Cesarean section    . Cerclage removal      No family  history on file.  History  Substance Use Topics  . Smoking status: Current Every Day Smoker -- 0.25 packs/day    Types: Cigarettes  . Smokeless tobacco: Not on file  . Alcohol Use: No    OB History   Grav Para Term Preterm Abortions TAB SAB Ect Mult Living                  Review of Systems  Constitutional: Positive for fever and chills.  HENT: Positive for rhinorrhea.   Eyes: Negative for visual disturbance.  Respiratory: Positive for shortness of breath. Negative for cough.   Cardiovascular: Positive for chest pain.  Gastrointestinal: Positive for vomiting, abdominal pain and diarrhea. Negative for blood in stool.  Genitourinary: Negative for dysuria and hematuria.  Musculoskeletal:       Body aches  Skin: Negative for rash.  Neurological: Positive for dizziness and headaches.  Psychiatric/Behavioral: Negative for confusion.  All other systems reviewed and are negative.    Allergies  Lamictal  Home Medications   Current Outpatient Rx  Name  Route  Sig  Dispense  Refill  . citalopram (CELEXA) 20 MG tablet   Oral   Take 20 mg by mouth daily.          . ergocalciferol (VITAMIN D2) 50000 UNITS capsule      Take one by mouth weekly x 6 weeks.  6 capsule   0   . etonogestrel (IMPLANON) 68 MG IMPL implant   Subcutaneous   Inject 1 each (68 mg total) into the skin once.   1 each   0   . promethazine (PHENERGAN) 25 MG tablet   Oral   Take 25 mg by mouth every 6 (six) hours as needed for nausea.         . sertraline (ZOLOFT) 50 MG tablet   Oral   Take 1 tablet (50 mg total) by mouth daily.   30 tablet   3   . topiramate (TOPAMAX) 50 MG tablet   Oral   Take 1 tablet (50 mg total) by mouth daily.   30 tablet   11   . zolpidem (AMBIEN) 5 MG tablet   Oral   Take 5 mg by mouth at bedtime as needed for sleep.         Marland Kitchen loperamide (IMODIUM) 2 MG capsule   Oral   Take 1 capsule (2 mg total) by mouth 4 (four) times daily as needed for diarrhea or  loose stools.   12 capsule   0   . ondansetron (ZOFRAN ODT) 4 MG disintegrating tablet   Oral   Take 1 tablet (4 mg total) by mouth every 8 (eight) hours as needed for nausea.   20 tablet   0   . promethazine (PHENERGAN) 25 MG tablet   Oral   Take 1 tablet (25 mg total) by mouth every 6 (six) hours as needed for nausea.   12 tablet   0     Triage Vitals: BP 123/78  Pulse 130  Temp(Src) 98.9 F (37.2 C) (Oral)  Resp 18  Ht 5\' 7"  (1.702 m)  Wt 228 lb (103.42 kg)  BMI 35.7 kg/m2  SpO2 100%  LMP 10/03/2012  Physical Exam  Nursing note and vitals reviewed. Constitutional: She is oriented to person, place, and time. She appears well-developed and well-nourished. No distress.  HENT:  Head: Normocephalic and atraumatic.  Mucous membranes are dry  Eyes: EOM are normal.  Neck: Neck supple. No tracheal deviation present.  Cardiovascular: Normal rate, regular rhythm and normal heart sounds.   No murmur heard. Pulmonary/Chest: Effort normal. No respiratory distress. She has no wheezes. She has no rales. She exhibits no tenderness.  Abdominal: Soft. Bowel sounds are normal. There is no tenderness. There is no rebound and no guarding.  Musculoskeletal: Normal range of motion. She exhibits no edema.  Lymphadenopathy:    She has no cervical adenopathy.  Neurological: She is alert and oriented to person, place, and time. No cranial nerve deficit.  Skin: Skin is warm and dry.  Psychiatric: She has a normal mood and affect. Her behavior is normal.    ED Course  Procedures (including critical care time) DIAGNOSTIC STUDIES: Oxygen Saturation is 100% on room air, nml by my interpretation.    COORDINATION OF CARE: 9:11 AM-Discussed treatment plan with pt at bedside and pt agreed to plan.   Medications  0.9 %  sodium chloride infusion ( Intravenous New Bag/Given 10/27/12 1010)  ondansetron (ZOFRAN) injection 4 mg (4 mg Intravenous Given 10/27/12 1012)  HYDROmorphone (DILAUDID)  injection 1 mg (1 mg Intravenous Given 10/27/12 1012)  sodium chloride 0.9 % bolus 1,000 mL (0 mLs Intravenous Stopped 10/27/12 1000)    Labs Reviewed  URINALYSIS, ROUTINE W REFLEX MICROSCOPIC - Abnormal; Notable for the following:    Specific Gravity, Urine >1.030 (*)    Hgb urine dipstick MODERATE (*)  All other components within normal limits  CBC WITH DIFFERENTIAL - Abnormal; Notable for the following:    WBC 17.2 (*)    RBC 5.49 (*)    Hemoglobin 16.4 (*)    Neutrophils Relative 88 (*)    Neutro Abs 15.1 (*)    Lymphocytes Relative 7 (*)    All other components within normal limits  BASIC METABOLIC PANEL - Abnormal; Notable for the following:    Glucose, Bld 110 (*)    GFR calc non Af Amer 81 (*)    All other components within normal limits  URINE MICROSCOPIC-ADD ON - Abnormal; Notable for the following:    Squamous Epithelial / LPF FEW (*)    All other components within normal limits  PREGNANCY, URINE   No results found. Results for orders placed during the hospital encounter of 10/27/12  URINALYSIS, ROUTINE W REFLEX MICROSCOPIC      Result Value Range   Color, Urine YELLOW  YELLOW   APPearance CLEAR  CLEAR   Specific Gravity, Urine >1.030 (*) 1.005 - 1.030   pH 5.5  5.0 - 8.0   Glucose, UA NEGATIVE  NEGATIVE mg/dL   Hgb urine dipstick MODERATE (*) NEGATIVE   Bilirubin Urine NEGATIVE  NEGATIVE   Ketones, ur NEGATIVE  NEGATIVE mg/dL   Protein, ur NEGATIVE  NEGATIVE mg/dL   Urobilinogen, UA 0.2  0.0 - 1.0 mg/dL   Nitrite NEGATIVE  NEGATIVE   Leukocytes, UA NEGATIVE  NEGATIVE  PREGNANCY, URINE      Result Value Range   Preg Test, Ur NEGATIVE  NEGATIVE  CBC WITH DIFFERENTIAL      Result Value Range   WBC 17.2 (*) 4.0 - 10.5 K/uL   RBC 5.49 (*) 3.87 - 5.11 MIL/uL   Hemoglobin 16.4 (*) 12.0 - 15.0 g/dL   HCT 16.1  09.6 - 04.5 %   MCV 83.4  78.0 - 100.0 fL   MCH 29.9  26.0 - 34.0 pg   MCHC 35.8  30.0 - 36.0 g/dL   RDW 40.9  81.1 - 91.4 %   Platelets 289  150 -  400 K/uL   Neutrophils Relative 88 (*) 43 - 77 %   Neutro Abs 15.1 (*) 1.7 - 7.7 K/uL   Lymphocytes Relative 7 (*) 12 - 46 %   Lymphs Abs 1.2  0.7 - 4.0 K/uL   Monocytes Relative 5  3 - 12 %   Monocytes Absolute 0.9  0.1 - 1.0 K/uL   Eosinophils Relative 0  0 - 5 %   Eosinophils Absolute 0.0  0.0 - 0.7 K/uL   Basophils Relative 0  0 - 1 %   Basophils Absolute 0.0  0.0 - 0.1 K/uL  BASIC METABOLIC PANEL      Result Value Range   Sodium 136  135 - 145 mEq/L   Potassium 3.5  3.5 - 5.1 mEq/L   Chloride 102  96 - 112 mEq/L   CO2 20  19 - 32 mEq/L   Glucose, Bld 110 (*) 70 - 99 mg/dL   BUN 13  6 - 23 mg/dL   Creatinine, Ser 7.82  0.50 - 1.10 mg/dL   Calcium 9.3  8.4 - 95.6 mg/dL   GFR calc non Af Amer 81 (*) >90 mL/min   GFR calc Af Amer >90  >90 mL/min  URINE MICROSCOPIC-ADD ON      Result Value Range   Squamous Epithelial / LPF FEW (*) RARE   WBC, UA 0-2  <  3 WBC/hpf   RBC / HPF 0-2  <3 RBC/hpf     1. Gastroenteritis       MDM  Leukocytosis noted. However patient's symptoms very consistent with a viral gastroenteritis acute onset of nausea vomiting and diarrhea. Epigastric abdominal discomfort however abdomen is completely soft and nontender. Symptoms do not at this point in time to be consistent with a gallbladder problem. Also no tenderness in the right upper quadrant. Will treat as if it's a gastroenteritis with anti-nausea medicine and antidiarrhea medicine. Patient will return for any newer worse symptoms or if not improved in the next 24 hours. Patient has not vomited here in the emergency department. Last vomiting occurred 3 in the morning.      I personally performed the services described in this documentation, which was scribed in my presence. The recorded information has been reviewed and is accurate.     Shelda Jakes, MD 10/27/12 [phone]

## 2012-11-14 ENCOUNTER — Telehealth: Payer: Self-pay

## 2012-11-14 NOTE — Telephone Encounter (Signed)
Wendy left v/m; pt finished Vit D 50,000 units on 11/13/12 and pts mom wants to verify the OTC Vit D pt is to take and how often. Left v/m for Toniann Fail to call back; Toniann Fail called back and advised per result note instructions 10/02/12 when pt finishes Vit D 50,000 IU; pt is to take Vit D 1600 IU daily; pt already has Vit D lab scheduled for 12/19/12 at 4pm. Toniann Fail voiced understanding.

## 2012-12-05 ENCOUNTER — Ambulatory Visit: Payer: 59 | Admitting: Psychology

## 2012-12-19 ENCOUNTER — Other Ambulatory Visit: Payer: 59

## 2013-04-27 ENCOUNTER — Ambulatory Visit: Payer: Self-pay | Admitting: Family Medicine

## 2013-06-04 ENCOUNTER — Encounter: Payer: Self-pay | Admitting: Internal Medicine

## 2013-06-11 ENCOUNTER — Other Ambulatory Visit (HOSPITAL_COMMUNITY)
Admission: RE | Admit: 2013-06-11 | Discharge: 2013-06-11 | Disposition: A | Discharge status: Home / Self Care | Payer: BC Managed Care – PPO | Source: Ambulatory Visit | Attending: Internal Medicine | Admitting: Internal Medicine

## 2013-06-11 ENCOUNTER — Ambulatory Visit (INDEPENDENT_AMBULATORY_CARE_PROVIDER_SITE_OTHER): Payer: BC Managed Care – PPO | Admitting: Internal Medicine

## 2013-06-11 ENCOUNTER — Encounter: Payer: Self-pay | Admitting: Internal Medicine

## 2013-06-11 VITALS — BP 100/78 | HR 115 | Temp 99.2°F | Ht 67.5 in | Wt 140.5 lb

## 2013-06-11 DIAGNOSIS — N760 Acute vaginitis: Secondary | ICD-10-CM | POA: Insufficient documentation

## 2013-06-11 DIAGNOSIS — Z113 Encounter for screening for infections with a predominantly sexual mode of transmission: Secondary | ICD-10-CM

## 2013-06-11 DIAGNOSIS — Z0000 Encounter for general adult medical examination without abnormal findings: Secondary | ICD-10-CM

## 2013-06-11 DIAGNOSIS — Z1151 Encounter for screening for human papillomavirus (HPV): Secondary | ICD-10-CM | POA: Insufficient documentation

## 2013-06-11 DIAGNOSIS — E669 Obesity, unspecified: Secondary | ICD-10-CM

## 2013-06-11 DIAGNOSIS — Z23 Encounter for immunization: Secondary | ICD-10-CM

## 2013-06-11 DIAGNOSIS — K219 Gastro-esophageal reflux disease without esophagitis: Secondary | ICD-10-CM

## 2013-06-11 DIAGNOSIS — Z01419 Encounter for gynecological examination (general) (routine) without abnormal findings: Secondary | ICD-10-CM | POA: Insufficient documentation

## 2013-06-11 DIAGNOSIS — Z124 Encounter for screening for malignant neoplasm of cervix: Secondary | ICD-10-CM

## 2013-06-11 NOTE — Patient Instructions (Signed)
Health Maintenance, 18- to 21-Year-Old SCHOOL PERFORMANCE After high school completion, the young adult may be attending college, technical or vocational school, or entering the military or the work force. SOCIAL AND EMOTIONAL DEVELOPMENT The young adult establishes adult relationships and explores sexual identity. Young adults may be living at home or in a college dorm or apartment. Increasing independence is important with young adults. Throughout these years, young adults should assume responsibility of their own health care. RECOMMENDED IMMUNIZATIONS  Influenza vaccine.  All adults should be immunized every year.  All adults, including pregnant women and people with hives-only allergy to eggs can receive the inactivated influenza (IIV) vaccine.  Adults aged 18 49 years can receive the recombinant influenza (RIV) vaccine. The RIV vaccine does not contain any egg protein.  Tetanus, diphtheria, and acellular pertussis (Td, Tdap) vaccine.  Pregnant women should receive 1 dose of Tdap vaccine during each pregnancy. The dose should be obtained regardless of the length of time since the last dose. Immunization is preferred during the 27th to 36th week of gestation.  An adult who has not previously received Tdap or who does not know his or her vaccine status should receive 1 dose of Tdap. This initial dose should be followed by tetanus and diphtheria toxoids (Td) booster doses every 10 years.  Adults with an unknown or incomplete history of completing a 3-dose immunization series with Td-containing vaccines should begin or complete a primary immunization series including a Tdap dose.  Adults should receive a Td booster every 10 years.  Varicella vaccine.  An adult without evidence of immunity to varicella should receive 2 doses or a second dose if he or she has previously received 1 dose.  Pregnant females who do not have evidence of immunity should receive the first dose after pregnancy.  This first dose should be obtained before leaving the health care facility. The second dose should be obtained 4 8 weeks after the first dose.  Human papillomavirus (HPV) vaccine.  Females aged 13 26 years who have not received the vaccine previously should obtain the 3-dose series.  The vaccine is not recommended for use in pregnant females. However, pregnancy testing is not needed before receiving a dose. If a female is found to be pregnant after receiving a dose, no treatment is needed. In that case, the remaining doses should be delayed until after the pregnancy.  Males aged 13 21 years who have not received the vaccine previously should receive the 3-dose series. Males aged 22 26 years may be immunized.  Immunization is recommended through the age of 26 years for any female who has sex with males and did not get any or all doses earlier.  Immunization is recommended for any person with an immunocompromised condition through the age of 26 years if he or she did not get any or all doses earlier.  During the 3-dose series, the second dose should be obtained 4 8 weeks after the first dose. The third dose should be obtained 24 weeks after the first dose and 16 weeks after the second dose.  Measles, mumps, and rubella (MMR) vaccine.  Adults born in 1957 or later should have 1 or more doses of MMR vaccine unless there is a contraindication to the vaccine or there is laboratory evidence of immunity to each of the three diseases.  A routine second dose of MMR vaccine should be obtained at least 28 days after the first dose for students attending postsecondary schools, health care workers, or international travelers.    For females of childbearing age, rubella immunity should be determined. If there is no evidence of immunity, females who are not pregnant should be vaccinated. If there is no evidence of immunity, females who are pregnant should delay immunization until after pregnancy.  Pneumococcal  13-valent conjugate (PCV13) vaccine.  When indicated, a person who is uncertain of his or her immunization history and has no record of immunization should receive the PCV13 vaccine.  An adult aged 19 years or older who has certain medical conditions and has not been previously immunized should receive 1 dose of PCV13 vaccine. This PCV13 should be followed with a dose of pneumococcal polysaccharide (PPSV23) vaccine. The PPSV23 vaccine dose should be obtained at least 8 weeks after the dose of PCV13 vaccine.  An adult aged 19 years or older who has certain medical conditions and previously received 1 or more doses of PPSV23 vaccine should receive 1 dose of PCV13. The PCV13 vaccine dose should be obtained 1 or more years after the last PPSV23 vaccine dose.  Pneumococcal polysaccharide (PPSV23) vaccine.  When PCV13 is also indicated, PCV13 should be obtained first.  An adult younger than age 65 years who has certain medical conditions should be immunized.  Any person who resides in a nursing home or long-term care facility should be immunized.  An adult smoker should be immunized.  People with an immunocompromised condition and certain other conditions should receive both PCV13 and PPSV23 vaccines.  People with human immunodeficiency virus (HIV) infection should be immunized as soon as possible after diagnosis.  Immunization during chemotherapy or radiation therapy should be avoided.  Routine use of PPSV23 vaccine is not recommended for American Indians, [place] Natives, or people younger than 65 years unless there are medical conditions that require PPSV23 vaccine.  When indicated, people who have unknown immunization and have no record of immunization should receive PPSV23 vaccine.  One-time revaccination 5 years after the first dose of PPSV23 is recommended for people aged 19 64 years who have chronic kidney failure, nephrotic syndrome, asplenia, or immunocompromised  conditions.  Meningococcal vaccine.  Adults with asplenia or persistent complement component deficiencies should receive 2 doses of quadrivalent meningococcal conjugate (MenACWY-D) vaccine. The doses should be obtained at least 2 months apart.  Microbiologists working with certain meningococcal bacteria, military recruits, people at risk during an outbreak, and people who travel to or live in countries with a high rate of meningitis should be immunized.  A first-year college student up through age 21 years who is living in a residence hall should receive a dose if he or she did not receive a dose on or after his or her 16th birthday.  Adults who have certain high-risk conditions should receive one or more doses of vaccine.  Hepatitis A vaccine.  Adults who wish to be protected from this disease, have certain high-risk conditions, work with hepatitis A-infected animals, work in hepatitis A research labs, or travel to or work in countries with a high rate of hepatitis A should be immunized.  Adults who were previously unvaccinated and who anticipate close contact with an international adoptee during the first 60 days after arrival in the [place] from a country with a high rate of hepatitis A should be immunized.  Hepatitis B vaccine.  Adults who wish to be protected from this disease, have certain high-risk conditions, may be exposed to blood or other infectious body fluids, are household contacts or sex partners of hepatitis B positive people, are clients or workers in   certain care facilities, or travel to or work in countries with a high rate of hepatitis B should be immunized.  Haemophilus influenzae type b (Hib) vaccine.  A previously unvaccinated person with asplenia or sickle cell disease or having a scheduled splenectomy should receive 1 dose of Hib vaccine.  Regardless of previous immunization, a recipient of a hematopoietic stem cell transplant should receive a 3-dose series 6  12 months after his or her successful transplant.  Hib vaccine is not recommended for adults with HIV infection. TESTING Annual screening for vision and hearing problems is recommended. Vision should be screened objectively at least once between 18 21 years of age. The young adult may be screened for anemia or tuberculosis. Young adults should have a blood test to check for high cholesterol during this time period. Young adults should be screened for use of alcohol and drugs. If the young adult is sexually active, screening for sexually transmitted infections, pregnancy, or HIV may be performed.  NUTRITION AND ORAL HEALTH  Adequate calcium intake is important. Consume 3 servings of low-fat milk and dairy products daily. For those who do not drink milk or consume dairy products, calcium enriched foods, such as juice, bread, or cereal, dark, leafy greens, or canned fish are alternate sources of calcium.  Drink plenty of water. Limit fruit juice to 8 12 ounces (240 360 mL) each day. Avoid sugary beverages or sodas.  Discourage skipping meals, especially breakfast. Young adults should eat a good variety of vegetables and fruits, as well as lean meats.  Avoid foods high in fat, salt, or sugar, such as candy, chips, and cookies.  Encourage young adults to participate in meal planning and preparation.  Eat meals together as a family whenever possible. Encourage conversation at mealtime.  Limit fast food choices and eating out at restaurants.  Brush teeth twice a day and floss.  Schedule dental exams twice a year. SLEEP Regular sleep habits are important. PHYSICAL, SOCIAL, AND EMOTIONAL DEVELOPMENT  One hour of regular physical activity daily is recommended. Continue to participate in sports.  Encourage young adults to develop their own interests and consider community service or volunteerism.  Provide guidance to the young adult in making decisions about college and work plans.  Make sure  that young adults know that they should never be in a situation that makes them uncomfortable, and they should tell partners if they do not want to engage in sexual activity.  Talk to the young adult about body image. Eating disorders may be noted at this time. Young adults may also be concerned about being overweight. Monitor the young adult for weight gain or loss.  Mood disturbances, depression, anxiety, alcoholism, or attention problems may be noted in young adults. Talk to the caregiver if there are concerns about mental illness.  Negotiate limit setting and independent decision making.  Encourage the young adult to handle conflict without physical violence.  Avoid loud noises which may impair hearing.  Limit television and computer time to 2 hours each day. Individuals who engage in excessive sedentary activity are more likely to become overweight. RISK BEHAVIORS  Sexually active young adults need to take precautions against pregnancy and sexually transmitted infections. Talk to young adults about contraception.  Provide a tobacco-free and drug-free environment for the young adult. Talk to the young adult about drug, tobacco, and alcohol use among friends or at friend's homes. Make sure the young adult knows that smoking tobacco or marijuana and taking drugs have health consequences and   may impact brain development.  Teach the young adult about appropriate use of over-the-counter or prescription medicines.  Establish guidelines for driving and for riding with friends.  Talk to young adults about the risks of drinking and driving or boating. Encourage the young adult to call you if he or she or friends have been drinking or using drugs.  Remind young adults to wear seat belts at all times in cars and life vests in boats.  Young adults should always wear a properly fitted helmet when they are riding a bicycle.  Use caution with all-terrain vehicles (ATVs) or other motorized  vehicles.  Do not keep handguns in the home. (If you do, the gun and ammunition should be locked separately and out of the young adult's access.)  Equip your home with smoke detectors and change the batteries regularly. Make sure all family members know the fire escape plans for your home.  Teach young adults not to swim alone and not to dive in shallow water.  All individuals should wear sunscreen when out in the sun. This minimizes sunburning. WHAT'S NEXT? Young adults should visit their pediatrician or family physician yearly. By young adulthood, health care should be transitioned to a family physician or internal medicine specialist. Sexually active females may want to begin annual physical exams with a gynecologist. Document Released: 09/16/2006 Document Revised: 10/16/2012 Document Reviewed: 10/06/2006 [employer] Patient Information 2014 [employer].  

## 2013-06-11 NOTE — Assessment & Plan Note (Signed)
Can not do Phentermine as pt is on zoloft and increased risk of serotonin syndrome Advised pt to work on diet and exercise Pt declines nutrition referral at this time

## 2013-06-11 NOTE — Progress Notes (Signed)
Subjective:    Patient ID: Dana Simpson, female    DOB: 07-28-1990, 22 y.o.   MRN: [id number]  HPI  Pt presents to the clinic today for her annual exam. She does have some concerns about pain in her stomach. This occurs almost every night after eating. She also c/o of foul smelling belching. She has never had symptoms like this in the past. She has not tried anything OTC. She denies vomiting or diarrhea bur has had some nausea. She also has some concerns about STD's. She did have unprotected sex in 2012 with a man who has many sexual partners. She denies any specific vaginal symptoms or complaints. She would just like to make sure she is "clean". She would also like to talk about diet pills. She does feel like she is obese. She does try to do push ups and sit ups at her home. She does not watch what she eats.  LMP: 3 months ago (been irregular since being on implanon) Pap Smear: 2012 Dentist: as needed Flu: 2012 Tetanus: 2013  Review of Systems      Past Medical History  Diagnosis Date  . Depression   . Migraines     Current Outpatient Prescriptions  Medication Sig Dispense Refill  . etonogestrel (IMPLANON) 68 MG IMPL implant Inject 1 each (68 mg total) into the skin once.  1 each  0  . topiramate (TOPAMAX) 50 MG tablet Take 1 tablet (50 mg total) by mouth daily.  30 tablet  11  . citalopram (CELEXA) 20 MG tablet Take 20 mg by mouth daily.       . ergocalciferol (VITAMIN D2) 50000 UNITS capsule Take one by mouth weekly x 6 weeks.  6 capsule  0  . loperamide (IMODIUM) 2 MG capsule Take 1 capsule (2 mg total) by mouth 4 (four) times daily as needed for diarrhea or loose stools.  12 capsule  0  . ondansetron (ZOFRAN ODT) 4 MG disintegrating tablet Take 1 tablet (4 mg total) by mouth every 8 (eight) hours as needed for nausea.  20 tablet  0  . promethazine (PHENERGAN) 25 MG tablet Take 25 mg by mouth every 6 (six) hours as needed for nausea.      . promethazine (PHENERGAN) 25 MG  tablet Take 1 tablet (25 mg total) by mouth every 6 (six) hours as needed for nausea.  12 tablet  0  . sertraline (ZOLOFT) 50 MG tablet Take 1 tablet (50 mg total) by mouth daily.  30 tablet  3  . zolpidem (AMBIEN) 5 MG tablet Take 5 mg by mouth at bedtime as needed for sleep.       No current facility-administered medications for this visit.    Allergies  Allergen Reactions  . Lamictal [Lamotrigine] Rash    Trudie Buckler syndrome     History reviewed. No pertinent family history.  History   Social History  . Marital Status: Single    Spouse Name: N/A    Number of Children: N/A  . Years of Education: N/A   Occupational History  . Not on file.   Social History Main Topics  . Smoking status: Former Smoker -- 0.25 packs/day    Types: Cigarettes    Start date: 06/04/2012  . Smokeless tobacco: Not on file  . Alcohol Use: No  . Drug Use: No  . Sexual Activity: Not on file   Other Topics Concern  . Not on file   Social History Narrative  . No  narrative on file     Constitutional: Denies fever, malaise, fatigue, headache or abrupt weight changes.  HEENT: Denies eye pain, eye redness, ear pain, ringing in the ears, wax buildup, runny nose, nasal congestion, bloody nose, or sore throat. Respiratory: Denies difficulty breathing, shortness of breath, cough or sputum production.   Cardiovascular: Denies chest pain, chest tightness, palpitations or swelling in the hands or feet.  Gastrointestinal: Denies abdominal pain, bloating, constipation, diarrhea or blood in the stool.  GU: Denies urgency, frequency, pain with urination, burning sensation, blood in urine, odor or discharge. Musculoskeletal: Denies decrease in range of motion, difficulty with gait, muscle pain or joint pain and swelling.  Skin: Denies redness, rashes, lesions or ulcercations.  Neurological: Denies dizziness, difficulty with memory, difficulty with speech or problems with balance and coordination.   No  other specific complaints in a complete review of systems (except as listed in HPI above).  Objective:   Physical Exam   BP 100/78  Pulse 115  Temp(Src) 99.2 F (37.3 C) (Oral)  Ht 5' 7.5" (1.715 m)  Wt 140 lb 8 oz (63.73 kg)  BMI 21.67 kg/m2  SpO2 97% Wt Readings from Last 3 Encounters:  06/11/13 140 lb 8 oz (63.73 kg)  10/27/12 228 lb (103.42 kg) (99%*, Z = 2.30)  10/02/12 229 lb (103.874 kg) (99%*, Z = 2.31)   * Growth percentiles are based on CDC 2-20 Years data.    General: Appears her stated age, obese but well developed, well nourished in NAD. Skin: Warm, dry and intact. No rashes, lesions or ulcerations noted. HEENT: Head: normal shape and size; Eyes: sclera white, no icterus, conjunctiva pink, PERRLA and EOMs intact; Ears: Tm's gray and intact, normal light reflex; Nose: mucosa pink and moist, septum midline; Throat/Mouth: Teeth present, mucosa pink and moist, no exudate, lesions or ulcerations noted.  Neck: Normal range of motion. Neck supple, trachea midline. No massses, lumps or thyromegaly present.  Cardiovascular: Normal rate and rhythm. S1,S2 noted.  No murmur, rubs or gallops noted. No JVD or BLE edema. No carotid bruits noted. Pulmonary/Chest: Normal effort and positive vesicular breath sounds. No respiratory distress. No wheezes, rales or ronchi noted.  Abdomen: Soft and nontender. Normal bowel sounds, no bruits noted. No distention or masses noted. Liver, spleen and kidneys non palpable. Musculoskeletal: Normal range of motion. No signs of joint swelling. No difficulty with gait.  Neurological: Alert and oriented. Cranial nerves II-XII intact. Coordination normal. +DTRs bilaterally. Psychiatric: Mood and affect normal. Behavior is normal. Judgment and thought content normal.    BMET    Component Value Date/Time   NA 136 10/27/2012 0927   K 3.5 10/27/2012 0927   CL 102 10/27/2012 0927   CO2 20 10/27/2012 0927   GLUCOSE 110* 10/27/2012 0927   BUN 13 10/27/2012  0927   CREATININE 1.00 10/27/2012 0927   CALCIUM 9.3 10/27/2012 0927   GFRNONAA 81* 10/27/2012 0927   GFRAA >90 10/27/2012 0927    Lipid Panel  No results found for this basename: chol, trig, hdl, cholhdl, vldl, ldlcalc    CBC    Component Value Date/Time   WBC 17.2* 10/27/2012 0927   RBC 5.49* 10/27/2012 0927   HGB 16.4* 10/27/2012 0927   HCT 45.8 10/27/2012 0927   PLT 289 10/27/2012 0927   MCV 83.4 10/27/2012 0927   MCH 29.9 10/27/2012 0927   MCHC 35.8 10/27/2012 0927   RDW 13.7 10/27/2012 0927   LYMPHSABS 1.2 10/27/2012 0927   MONOABS 0.9 10/27/2012 0927  EOSABS 0.0 10/27/2012 0927   BASOSABS 0.0 10/27/2012 0927    Hgb A1C No results found for this basename: HGBA1C        Assessment & Plan:   Preventative Health Maintenance:  Will give flu shot today Will obtain screening labs including A1C per pt request Encouraged pt to visit a dentist on a more regular basis Will screen for STD's including HIV and RPR  RTC in 1 year or sooner if needed

## 2013-06-11 NOTE — Progress Notes (Signed)
Pre-visit discussion using our clinic review tool. No additional management support is needed unless otherwise documented below in the visit note.  

## 2013-06-11 NOTE — Assessment & Plan Note (Signed)
Will start prilosec daily  RTC in 2 weeks if no improvement

## 2013-06-12 LAB — RPR

## 2013-06-12 LAB — CBC
Hemoglobin: 15.6 g/dL — ABNORMAL HIGH (ref 12.0–15.0)
MCHC: 33.8 g/dL (ref 30.0–36.0)
MCV: 84.8 fl (ref 78.0–100.0)
Platelets: 382 10*3/uL (ref 150.0–400.0)
RBC: 5.43 Mil/uL — ABNORMAL HIGH (ref 3.87–5.11)

## 2013-06-12 LAB — LDL CHOLESTEROL, DIRECT: Direct LDL: 156.4 mg/dL

## 2013-06-12 LAB — COMPREHENSIVE METABOLIC PANEL
AST: 21 U/L (ref 0–37)
Albumin: 4.2 g/dL (ref 3.5–5.2)
BUN: 16 mg/dL (ref 6–23)
Calcium: 9.4 mg/dL (ref 8.4–10.5)
Chloride: 103 mEq/L (ref 96–112)
Creatinine, Ser: 1.2 mg/dL (ref 0.4–1.2)
Glucose, Bld: 97 mg/dL (ref 70–99)
Potassium: 4.5 mEq/L (ref 3.5–5.1)

## 2013-06-12 LAB — LIPID PANEL
Cholesterol: 253 mg/dL — ABNORMAL HIGH (ref 0–200)
HDL: 24.2 mg/dL — ABNORMAL LOW (ref >39.00)
Triglycerides: 543 mg/dL — ABNORMAL HIGH (ref 0.0–149.0)

## 2013-06-13 ENCOUNTER — Telehealth: Payer: Self-pay

## 2013-06-13 ENCOUNTER — Encounter: Payer: Self-pay | Admitting: *Deleted

## 2013-06-13 ENCOUNTER — Telehealth: Payer: Self-pay | Admitting: *Deleted

## 2013-06-13 NOTE — Telephone Encounter (Signed)
Opened in error

## 2013-06-13 NOTE — Telephone Encounter (Signed)
Per your result note, you wanted to send in Zetia for pt, please advise what strength?

## 2013-06-14 ENCOUNTER — Other Ambulatory Visit: Payer: Self-pay | Admitting: Internal Medicine

## 2013-06-14 MED ORDER — EZETIMIBE 10 MG PO TABS: 10.0000 mg | ORAL_TABLET | Freq: Every day | ORAL | Status: DC

## 2013-06-14 NOTE — Telephone Encounter (Signed)
I sent in the 10 mg tabs to her pharmacy

## 2013-07-03 ENCOUNTER — Telehealth: Payer: Self-pay

## 2013-07-03 NOTE — Telephone Encounter (Signed)
Pt said she has stopped zoloft; pt has been off zoloft for one week. Pt request rx for phentermine. Pt also request med for bipolar condition and depression and mood swings. Not SI/HI. Advise pt if she needs medication for bipolar, depression and mood swings and wants diet pills; pt should schedule appt. Pt said she can only come in the morning time due to work schedule. Pt scheduled appt with Dr Dayton Martes on 07/09/13 and if pt condition changes or worsens prior to appt pt will cb or go to UC>

## 2013-07-03 NOTE — Telephone Encounter (Signed)
Noted  

## 2013-07-09 ENCOUNTER — Ambulatory Visit: Payer: Self-pay | Admitting: Family Medicine

## 2013-07-17 ENCOUNTER — Other Ambulatory Visit: Payer: Self-pay | Admitting: *Deleted

## 2013-07-17 NOTE — Telephone Encounter (Signed)
Faxed refill request. Please advise.  

## 2013-07-18 NOTE — Telephone Encounter (Signed)
Message left with pharmacy indicating Rx has been denied.

## 2013-07-23 ENCOUNTER — Encounter: Payer: Self-pay | Admitting: Family Medicine

## 2013-07-23 ENCOUNTER — Ambulatory Visit (INDEPENDENT_AMBULATORY_CARE_PROVIDER_SITE_OTHER): Payer: BC Managed Care – PPO | Admitting: Family Medicine

## 2013-07-23 VITALS — BP 146/82 | HR 92 | Temp 98.5°F | Ht 67.0 in | Wt 242.2 lb

## 2013-07-23 DIAGNOSIS — F39 Unspecified mood [affective] disorder: Secondary | ICD-10-CM

## 2013-07-23 DIAGNOSIS — F419 Anxiety disorder, unspecified: Secondary | ICD-10-CM

## 2013-07-23 DIAGNOSIS — E669 Obesity, unspecified: Secondary | ICD-10-CM

## 2013-07-23 NOTE — Assessment & Plan Note (Signed)
>  25 min spent with face to face with patient, >50% counseling and/or coordinating care  Discussed importance of treating her mood disorder.  Will refer to Dr. Imogene Burnhen.  She needs to be on an antipsychotic, not an antidepressant.  Given her h/o stevens Johnsons syndrome, it is outside my scope of practice to manage her bipolar disorder. The patient indicates understanding of these issues and agrees with the plan.

## 2013-07-23 NOTE — Progress Notes (Signed)
Pre-visit discussion using our clinic review tool. No additional management support is needed unless otherwise documented below in the visit note.  

## 2013-07-23 NOTE — Progress Notes (Signed)
Subjective:    Patient ID: Dana Simpson, female    DOB: [birth year], 23 y.o.   MRN: [id number]  HPI Very pleasant 23 yo female here to follow up mood disorder and obesity.   Wants a weight loss pill.  Saw Nicki Reaper in 06/2013 for CPE and was told she cannot take phentermine and SSRI due to serotonin syndrome.  So, she stopped taking Zoloft prescribed by [facility] psychiatrist and is here today for "weight loss pill."  Per pt, was diagnosed with biopolar disorder.     Did have a suicide attempt after she miscarried her child a few years ago.  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  Currently taking Celexa 20 mg twice daily which she thinks is working pretty well but still has bouts of anxiety and depression.  I also referred her to see Dr. Laymond Purser last month.  She has not seen her yet- pt had to reschedule multiple times.  Her boyfriend is in prison but she lives with her mom who helps care for her child. 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  She had multiple intolerances to several antidepressants and anxiolytics. 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  She is taking Gabepentin and trazadone at night prescribed by psychiatry. .  Patient Active Problem List   Diagnosis Date Noted  . GERD (gastroesophageal reflux disease) 06/11/2013  . Obesity, unspecified 06/11/2013  . Depression 10/02/2012  . Eczema 08/11/2012  . History of drug dependence 05/22/2012  . Anxiety 05/22/2012  . Migraine headache 05/22/2012  . History of suicide attempt 05/22/2012  . TOBACCO ABUSE 01/15/2008   Past Medical History  Diagnosis Date  . Depression   . Migraines    Past Surgical History  Procedure Laterality Date  . Tonsillectomy    . Adenoidectomy    . Cesarean section    . Cerclage removal      History  Substance Use Topics  . Smoking status: Former Smoker -- 0.25 packs/day    Types: Cigarettes    Start date: 06/04/2012  . Smokeless tobacco: Not on file  . Alcohol Use: No   No family history on file. Allergies  Allergen Reactions  . Lamictal [Lamotrigine] Rash    Trudie Buckler syndrome    Current Outpatient Prescriptions on File Prior to Visit  Medication Sig Dispense Refill  . ergocalciferol (VITAMIN D2) 50000 UNITS capsule Take one by mouth weekly x 6 weeks.  6 capsule  0  . etonogestrel (IMPLANON) 68 MG IMPL implant Inject 1 each (68 mg total) into the skin once.  1 each  0  . ezetimibe (ZETIA) 10 MG tablet Take 1 tablet (10 mg total) by mouth daily.  30 tablet  2  . loperamide (IMODIUM) 2 MG capsule Take 1 capsule (2 mg total) by mouth 4 (four) times daily as needed for diarrhea or loose stools.  12 capsule  0  . ondansetron (ZOFRAN ODT) 4 MG disintegrating tablet Take 1 tablet (4 mg total) by mouth every 8 (eight) hours as needed for nausea.  20 tablet  0  . promethazine (PHENERGAN) 25 MG tablet Take 1 tablet (25 mg total) by mouth every 6 (six) hours as needed for nausea.  12 tablet  0  . topiramate (TOPAMAX) 50 MG tablet  Take 1 tablet (50 mg total) by mouth daily.  30 tablet  11  . zolpidem (AMBIEN) 5 MG tablet Take 5 mg by mouth at bedtime as needed for sleep.       No current facility-administered medications on file prior to visit.   The PMH, PSH, Social History, Family History, Medications, and allergies [facility], and have been updated if relevant.    Review of Systems See HPI    Objective:   Physical Exam BP 146/82  Pulse 92  Temp(Src) 98.5 F (36.9 C) (Oral)  Ht 5\' 7"  (1.702 m)  Wt 242 lb 4 oz (109.884 kg)  BMI 37.93 kg/m2  SpO2 97%  General:  Well-developed,well-nourished,in no acute distress; alert,appropriate and cooperative throughout examination Psych:  Cognition and judgment appear intact. Alert and cooperative with  normal attention span and concentration. No apparent delusions, illusions, hallucinations     Assessment & Plan:

## 2013-07-23 NOTE — Assessment & Plan Note (Signed)
Advised her that we need to get her mood disorder under control.  She does not want to see a nutritionist. [facility]- I advised bringing something or chosing healthier food options.

## 2013-07-23 NOTE — Patient Instructions (Signed)
Great to see you. Please stop by to see Dana Simpson on your way out.   

## 2013-07-25 ENCOUNTER — Telehealth: Payer: Self-pay | Admitting: Family Medicine

## 2013-07-25 MED ORDER — SERTRALINE HCL 50 MG PO TABS: 50.0000 mg | ORAL_TABLET | Freq: Every day | ORAL | Status: DC

## 2013-07-25 NOTE — Telephone Encounter (Signed)
Called Dr Veryl SpeakAarti Kapurs office to ask them to see our patient ASAP. They gave us an appt February 13th at 12:30 . They are helping us out as the typical New patient is out until April. Called the patient and she asked me to ask you to please re-start her on the Zoloft until she sees her new Psychiatrist Dr Maryruth BunKapur and gets straightened out on all her medicine. She is only asking for a 1 month RX supply.Please call the patient to let her know what you can do.

## 2013-07-25 NOTE — Telephone Encounter (Signed)
Great, thanks.  Rx refilled.

## 2013-08-23 ENCOUNTER — Ambulatory Visit (INDEPENDENT_AMBULATORY_CARE_PROVIDER_SITE_OTHER): Payer: BC Managed Care – PPO | Admitting: Family Medicine

## 2013-08-23 ENCOUNTER — Encounter: Payer: Self-pay | Admitting: Family Medicine

## 2013-08-23 VITALS — BP 128/78 | HR 94 | Temp 98.1°F | Wt 235.8 lb

## 2013-08-23 DIAGNOSIS — J029 Acute pharyngitis, unspecified: Secondary | ICD-10-CM

## 2013-08-23 DIAGNOSIS — R112 Nausea with vomiting, unspecified: Secondary | ICD-10-CM

## 2013-08-23 LAB — POCT RAPID STREP A (OFFICE): Rapid Strep A Screen: NEGATIVE

## 2013-08-23 MED ORDER — PROMETHAZINE HCL 25 MG PO TABS: 25.0000 mg | ORAL_TABLET | Freq: Four times a day (QID) | ORAL | Status: DC | PRN

## 2013-08-23 NOTE — Progress Notes (Signed)
Pre visit review using our clinic review tool, if applicable. No additional management support is needed unless otherwise documented below in the visit note. 

## 2013-08-23 NOTE — Progress Notes (Signed)
SUBJECTIVE:  Dana Simpson is a 23 y.o. female who complains of sore throat, dry cough and vomiting for 2 days. She denies a history of dizziness, fevers, shortness of breath and wheezing and denies a history of asthma. Patient denies smoke cigarettes.   Patient Active Problem List   Diagnosis Date Noted  . Nausea with vomiting 08/23/2013  . Acute pharyngitis 08/23/2013  . Unspecified episodic mood disorder 07/23/2013  . GERD (gastroesophageal reflux disease) 06/11/2013  . Obesity, unspecified 06/11/2013  . Depression 10/02/2012  . Eczema 08/11/2012  . History of drug dependence 05/22/2012  . Anxiety 05/22/2012  . Migraine headache 05/22/2012  . History of suicide attempt 05/22/2012  . TOBACCO ABUSE 01/15/2008   Past Medical History  Diagnosis Date  . Depression   . Migraines    Past Surgical History  Procedure Laterality Date  . Tonsillectomy    . Adenoidectomy    . Cesarean section    . Cerclage removal     History  Substance Use Topics  . Smoking status: Former Smoker -- 0.25 packs/day    Types: Cigarettes    Start date: 06/04/2012  . Smokeless tobacco: Not on file  . Alcohol Use: No   No family history on file. Allergies  Allergen Reactions  . Lamictal [Lamotrigine] Rash    Trudie BucklerSteven johnson syndrome    Current Outpatient Prescriptions on File Prior to Visit  Medication Sig Dispense Refill  . ergocalciferol (VITAMIN D2) 50000 UNITS capsule Take one by mouth weekly x 6 weeks.  6 capsule  0  . etonogestrel (IMPLANON) 68 MG IMPL implant Inject 1 each (68 mg total) into the skin once.  1 each  0  . ezetimibe (ZETIA) 10 MG tablet Take 1 tablet (10 mg total) by mouth daily.  30 tablet  2  . gabapentin (NEURONTIN) 100 MG capsule Take 100 mg by mouth 3 (three) times daily.      Marland Kitchen. loperamide (IMODIUM) 2 MG capsule Take 1 capsule (2 mg total) by mouth 4 (four) times daily as needed for diarrhea or loose stools.  12 capsule  0  . sertraline (ZOLOFT) 50 MG tablet Take 1  tablet (50 mg total) by mouth daily.  30 tablet  0  . topiramate (TOPAMAX) 50 MG tablet Take 1 tablet (50 mg total) by mouth daily.  30 tablet  11  . traZODone (DESYREL) 150 MG tablet Take by mouth at bedtime.      Marland Kitchen. zolpidem (AMBIEN) 5 MG tablet Take 5 mg by mouth at bedtime as needed for sleep.       No current facility-administered medications on file prior to visit.   The PMH, PSH, Social History, Family History, Medications, and allergies have been reviewed [facility], and have been updated if relevant.  OBJECTIVE: BP 128/78  Pulse 94  Temp(Src) 98.1 F (36.7 C) (Oral)  Wt 235 lb 12 oz (106.935 kg)  SpO2 96%  She appears well, vital signs are as noted. Ears normal.  Throat and pharynx normal.  Neck supple. No adenopathy in the neck. Nose is congested. Sinuses non tender. The chest is clear, without wheezes or rales.  ASSESSMENT:  Rapid strep neg viral upper respiratory illness  PLAN:  Symptomatic therapy suggested: push fluids, rest and return office visit prn if symptoms persist or worsen. Lack of antibiotic effectiveness discussed with her. Phenergan eRx for nausea/vomiting Call or return to clinic prn if these symptoms worsen or fail to improve as anticipated.

## 2013-12-07 ENCOUNTER — Ambulatory Visit: Payer: Self-pay | Admitting: Internal Medicine

## 2015-04-16 ENCOUNTER — Encounter: Payer: Self-pay | Admitting: Urgent Care

## 2015-04-16 DIAGNOSIS — Z720 Tobacco use: Secondary | ICD-10-CM | POA: Insufficient documentation

## 2015-04-16 DIAGNOSIS — M545 Low back pain: Secondary | ICD-10-CM | POA: Insufficient documentation

## 2015-04-16 DIAGNOSIS — Z3202 Encounter for pregnancy test, result negative: Secondary | ICD-10-CM | POA: Insufficient documentation

## 2015-04-16 DIAGNOSIS — Z79899 Other long term (current) drug therapy: Secondary | ICD-10-CM | POA: Insufficient documentation

## 2015-04-16 LAB — POCT PREGNANCY, URINE: Preg Test, Ur: NEGATIVE

## 2015-04-16 NOTE — ED Notes (Signed)
Patient presents with c/o RIGHT lower back pain x 2-3 days. Patient denies urinary symptoms, however reports that she thought that she had a UTI x 2 weeks ago - started taking Azo - took some today as well. Denies fever.

## 2015-04-17 ENCOUNTER — Emergency Department
Admission: EM | Admit: 2015-04-17 | Discharge: 2015-04-17 | Disposition: A | Discharge status: Home / Self Care | Payer: Self-pay | Attending: Emergency Medicine | Admitting: Emergency Medicine

## 2015-04-17 DIAGNOSIS — M545 Low back pain, unspecified: Secondary | ICD-10-CM

## 2015-04-17 LAB — URINALYSIS COMPLETE WITH MICROSCOPIC (ARMC ONLY)
BILIRUBIN URINE: NEGATIVE
Bacteria, UA: NONE SEEN
GLUCOSE, UA: NEGATIVE mg/dL
HGB URINE DIPSTICK: NEGATIVE
Ketones, ur: NEGATIVE mg/dL
LEUKOCYTES UA: NEGATIVE
Nitrite: POSITIVE — AB
Protein, ur: NEGATIVE mg/dL
RBC / HPF: NONE SEEN RBC/hpf (ref 0–5)
SPECIFIC GRAVITY, URINE: 1.025 (ref 1.005–1.030)
pH: 5 (ref 5.0–8.0)

## 2015-04-17 MED ORDER — CYCLOBENZAPRINE HCL 10 MG PO TABS: 10.0000 mg | ORAL_TABLET | Freq: Three times a day (TID) | ORAL | Status: DC | PRN

## 2015-04-17 MED ORDER — CYCLOBENZAPRINE HCL 10 MG PO TABS: 10.0000 mg | ORAL_TABLET | Freq: Once | ORAL | Status: DC

## 2015-04-17 MED ORDER — CYCLOBENZAPRINE HCL 10 MG PO TABS
10.0000 mg | ORAL_TABLET | Freq: Once | ORAL | Status: AC
  Administered 2015-04-17: 10 mg via ORAL
  Filled 2015-04-17: qty 1

## 2015-04-17 NOTE — ED Provider Notes (Signed)
[facility] Emergency Department Provider Note  ____________________________________________  Time seen: 2:30 AM  I have reviewed the triage vital signs and the nursing notes.   HISTORY  Chief Complaint Back Pain     HPI Sheffield SliderMiriam L Allen is a 24 y.o. female presents with right lower back pain 2-3 days that is worse with movement. Patient denies any dysuria, urinary frequency no hematuria nausea or vomiting. Patient denies any abdominal pain. Patient denies any fever. Current pain score is 0     Past Medical History  Diagnosis Date  . Depression   . Migraines     Patient Active Problem List   Diagnosis Date Noted  . Nausea with vomiting 08/23/2013  . Acute pharyngitis 08/23/2013  . Unspecified episodic mood disorder 07/23/2013  . GERD (gastroesophageal reflux disease) 06/11/2013  . Obesity, unspecified 06/11/2013  . Depression 10/02/2012  . Eczema 08/11/2012  . History of drug dependence (HCC) 05/22/2012  . Anxiety 05/22/2012  . Migraine headache 05/22/2012  . History of suicide attempt 05/22/2012  . TOBACCO ABUSE 01/15/2008    Past Surgical History  Procedure Laterality Date  . Tonsillectomy    . Adenoidectomy    . Cesarean section    . Cerclage removal      Current Outpatient Rx  Name  Route  Sig  Dispense  Refill  . ergocalciferol (VITAMIN D2) 50000 UNITS capsule      Take one by mouth weekly x 6 weeks.   6 capsule   0   . etonogestrel (IMPLANON) 68 MG IMPL implant   Subcutaneous   Inject 1 each (68 mg total) into the skin once.   1 each   0   . ezetimibe (ZETIA) 10 MG tablet   Oral   Take 1 tablet (10 mg total) by mouth daily.   30 tablet   2   . gabapentin (NEURONTIN) 100 MG capsule   Oral   Take 100 mg by mouth 3 (three) times daily.         Marland Kitchen. loperamide (IMODIUM) 2 MG capsule   Oral   Take 1 capsule (2 mg total) by mouth 4 (four) times daily as needed for diarrhea or loose stools.   12 capsule   0   .  promethazine (PHENERGAN) 25 MG tablet   Oral   Take 1 tablet (25 mg total) by mouth every 6 (six) hours as needed for nausea.   12 tablet   0   . sertraline (ZOLOFT) 50 MG tablet   Oral   Take 1 tablet (50 mg total) by mouth daily.   30 tablet   0   . topiramate (TOPAMAX) 50 MG tablet   Oral   Take 1 tablet (50 mg total) by mouth daily.   30 tablet   11   . traZODone (DESYREL) 150 MG tablet   Oral   Take by mouth at bedtime.         Marland Kitchen. zolpidem (AMBIEN) 5 MG tablet   Oral   Take 5 mg by mouth at bedtime as needed for sleep.           Allergies Lamictal  No family history on file.  Social History Social History  Substance Use Topics  . Smoking status: Current Every Day Smoker -- 0.25 packs/day    Types: Cigarettes    Start date: 06/04/2012  . Smokeless tobacco: None  . Alcohol Use: Yes    Review of Systems  Constitutional: Negative for fever. Eyes:  Negative for visual changes. ENT: Negative for sore throat. Cardiovascular: Negative for chest pain. Respiratory: Negative for shortness of breath. Gastrointestinal: Negative for abdominal pain, vomiting and diarrhea. Genitourinary: Negative for dysuria. Musculoskeletal: Positive for back pain. Skin: Negative for rash. Neurological: Negative for headaches, focal weakness or numbness.   10-point ROS otherwise negative.  ____________________________________________   PHYSICAL EXAM:  VITAL SIGNS: ED Triage Vitals  Enc Vitals Group     BP 04/16/15 2245 129/81 mmHg     Pulse Rate 04/16/15 2245 73     Resp 04/16/15 2245 18     Temp 04/16/15 2245 97.9 F (36.6 C)     Temp Source 04/16/15 2245 Oral     SpO2 04/16/15 2245 96 %     Weight 04/16/15 2245 215 lb (97.523 kg)     Height 04/16/15 2245  (1.727 m)     Head Cir --      Peak Flow --      Pain Score 04/16/15 2246 6     Pain Loc --      Pain Edu? --      Excl. in GC? --     Constitutional: Alert and oriented. Well appearing and in no  distress. Eyes: Conjunctivae are normal. PERRL. Normal extraocular movements. ENT   Head: Normocephalic and atraumatic.   Nose: No congestion/rhinnorhea.   Mouth/Throat: Mucous membranes are moist.   Neck: No stridor. Hematological/Lymphatic/Immunilogical: No cervical lymphadenopathy. Cardiovascular: Normal rate, regular rhythm. Normal and symmetric distal pulses are present in all extremities. No murmurs, rubs, or gallops. Respiratory: Normal respiratory effort without tachypnea nor retractions. Breath sounds are clear and equal bilaterally. No wheezes/rales/rhonchi. Gastrointestinal: Soft and nontender. No distention. There is no CVA tenderness. Genitourinary: deferred Musculoskeletal: Nontender with normal range of motion in all extremities. No joint effusions.  No lower extremity tenderness nor edema. Pain with palpation of the right paraspinal muscles Neurologic:  Normal speech and language. No gross focal neurologic deficits are appreciated. Speech is normal.  Skin:  Skin is warm, dry and intact. No rash noted. Psychiatric: Mood and affect are normal. Speech and behavior are normal. Patient exhibits appropriate insight and judgment.  ____________________________________________    LABS (pertinent positives/negatives)  Labs Reviewed  URINALYSIS COMPLETEWITH MICROSCOPIC ([facility] ONLY) - Abnormal; Notable for the following:    Squamous Epithelial / LPF 0-5 (*)    All other components within normal limits  POCT PREGNANCY, URINE       INITIAL IMPRESSION / ASSESSMENT AND PLAN / ED COURSE  Pertinent labs & imaging results that were available during my care of the patient were reviewed by me and considered in my medical decision making (see chart for details).    ____________________________________________   FINAL CLINICAL IMPRESSION(S) / ED DIAGNOSES  Final diagnoses:  Right-sided low back pain without sciatica      Darci Current, MD 04/17/15 [phone]

## 2015-04-17 NOTE — Discharge Instructions (Signed)

## 2015-05-10 ENCOUNTER — Emergency Department (HOSPITAL_COMMUNITY): Payer: Medicaid Other

## 2015-05-10 ENCOUNTER — Emergency Department (HOSPITAL_COMMUNITY)
Admission: EM | Admit: 2015-05-10 | Discharge: 2015-05-10 | Disposition: A | Discharge status: Home / Self Care | Payer: Medicaid Other | Attending: Emergency Medicine | Admitting: Emergency Medicine

## 2015-05-10 ENCOUNTER — Encounter (HOSPITAL_COMMUNITY): Payer: Self-pay | Admitting: Emergency Medicine

## 2015-05-10 DIAGNOSIS — F329 Major depressive disorder, single episode, unspecified: Secondary | ICD-10-CM | POA: Diagnosis not present

## 2015-05-10 DIAGNOSIS — G43909 Migraine, unspecified, not intractable, without status migrainosus: Secondary | ICD-10-CM | POA: Diagnosis not present

## 2015-05-10 DIAGNOSIS — Z79899 Other long term (current) drug therapy: Secondary | ICD-10-CM | POA: Insufficient documentation

## 2015-05-10 DIAGNOSIS — Z3401 Encounter for supervision of normal first pregnancy, first trimester: Secondary | ICD-10-CM

## 2015-05-10 DIAGNOSIS — O9989 Other specified diseases and conditions complicating pregnancy, childbirth and the puerperium: Secondary | ICD-10-CM | POA: Diagnosis present

## 2015-05-10 DIAGNOSIS — O99411 Diseases of the circulatory system complicating pregnancy, first trimester: Secondary | ICD-10-CM | POA: Insufficient documentation

## 2015-05-10 DIAGNOSIS — O99341 Other mental disorders complicating pregnancy, first trimester: Secondary | ICD-10-CM | POA: Diagnosis not present

## 2015-05-10 DIAGNOSIS — Z3A01 Less than 8 weeks gestation of pregnancy: Secondary | ICD-10-CM | POA: Diagnosis not present

## 2015-05-10 DIAGNOSIS — R109 Unspecified abdominal pain: Secondary | ICD-10-CM

## 2015-05-10 DIAGNOSIS — O99331 Smoking (tobacco) complicating pregnancy, first trimester: Secondary | ICD-10-CM | POA: Diagnosis not present

## 2015-05-10 DIAGNOSIS — F17210 Nicotine dependence, cigarettes, uncomplicated: Secondary | ICD-10-CM | POA: Insufficient documentation

## 2015-05-10 DIAGNOSIS — O26899 Other specified pregnancy related conditions, unspecified trimester: Secondary | ICD-10-CM

## 2015-05-10 LAB — URINALYSIS, ROUTINE W REFLEX MICROSCOPIC
Bilirubin Urine: NEGATIVE
GLUCOSE, UA: NEGATIVE mg/dL
HGB URINE DIPSTICK: NEGATIVE
Ketones, ur: 15 mg/dL — AB
LEUKOCYTES UA: NEGATIVE
Nitrite: NEGATIVE
PROTEIN: NEGATIVE mg/dL
SPECIFIC GRAVITY, URINE: 1.024 (ref 1.005–1.030)
Urobilinogen, UA: 0.2 mg/dL (ref 0.0–1.0)
pH: 5.5 (ref 5.0–8.0)

## 2015-05-10 LAB — HCG, QUANTITATIVE, PREGNANCY: hCG, Beta Chain, Quant, S: 3371 m[IU]/mL — ABNORMAL HIGH (ref <5)

## 2015-05-10 LAB — RAPID HIV SCREEN (HIV 1/2 AB+AG)
HIV 1/2 Antibodies: NONREACTIVE
HIV-1 P24 ANTIGEN - HIV24: NONREACTIVE

## 2015-05-10 LAB — I-STAT BETA HCG BLOOD, ED (MC, WL, AP ONLY): I-stat hCG, quantitative: >2000 m[IU]/mL — ABNORMAL HIGH (ref <5)

## 2015-05-10 LAB — WET PREP, GENITAL
CLUE CELLS WET PREP: NONE SEEN
Trich, Wet Prep: NONE SEEN
Yeast Wet Prep HPF POC: NONE SEEN

## 2015-05-10 NOTE — ED Notes (Signed)
Pt from home with c/o lower abdominal cramping x 1 week and dizziness with increased activity.  Pt reports 2 positive home pregnancy tests 2 weeks ago and the health department confirmed pregnancy also.  Denies bleeding or discharge.  Last period approx 03/30/15, placed pt at 5 weeks 6 days.  Hx of incompetent cervix.  Pt in NAD, A&O.

## 2015-05-10 NOTE — ED Provider Notes (Addendum)
CSN: [id number]     Arrival date & time 05/10/15  0919 History   First MD Initiated Contact with Patient 05/10/15 [phone]     Chief Complaint  Patient presents with  . Abdominal Cramping  . Possible Pregnancy     (Consider location/radiation/quality/duration/timing/severity/associated sxs/prior Treatment) Patient is a 24 y.o. female presenting with cramps and pregnancy problem. The history is provided by the patient.  Abdominal Cramping This is a new problem. Episode onset: 1 week. The problem occurs daily. The problem has not changed since onset.Associated symptoms comments: Patient's last menstrual period was September 25. She checked multiple home pregnancy tests that were positive and it was a confirmed pregnancy at the health department in October. She states for the last 1 week she's had intermittent lower pelvic discomfort. The pain will last for minutes to hours and resolve spontaneously. It is not associated with movement. She denies any dysuria, vaginal discharge, itching or bleeding. She was sexually active last night uncomfortable.. Nothing aggravates the symptoms. Nothing relieves the symptoms. She has tried nothing for the symptoms. The treatment provided no relief.  Possible Pregnancy    Past Medical History  Diagnosis Date  . Depression   . Migraines    Past Surgical History  Procedure Laterality Date  . Tonsillectomy    . Adenoidectomy    . Cesarean section    . Cerclage removal     History reviewed. No pertinent family history. Social History  Substance Use Topics  . Smoking status: Current Every Day Smoker -- 0.25 packs/day    Types: Cigarettes    Start date: 06/04/2012  . Smokeless tobacco: None  . Alcohol Use: Yes   OB History    Gravida Para Term Preterm AB TAB SAB Ectopic Multiple Living   1              Review of Systems  All other systems reviewed and are negative.     Allergies  Lamictal  Home Medications   Prior to Admission medications    Medication Sig Start Date End Date Taking? Authorizing Provider  Prenatal Vit-Fe Fumarate-FA (PRENATAL MULTIVITAMIN) TABS tablet Take 1 tablet by mouth daily at 12 noon.   Yes Historical Provider, MD  cyclobenzaprine (FLEXERIL) 10 MG tablet Take 1 tablet (10 mg total) by mouth 3 (three) times daily as needed for muscle spasms. Patient not taking: Reported on 05/10/2015 04/17/15   Darci Currentandolph N Brown, MD  ergocalciferol (VITAMIN D2) 50000 UNITS capsule Take one by mouth weekly x 6 weeks. Patient not taking: Reported on 05/10/2015 10/03/12   Dianne Dunalia M Aron, MD  etonogestrel (IMPLANON) 68 MG IMPL implant Inject 1 each (68 mg total) into the skin once. Patient not taking: Reported on 05/10/2015 05/22/12   Dianne Dunalia M Aron, MD  ezetimibe (ZETIA) 10 MG tablet Take 1 tablet (10 mg total) by mouth daily. Patient not taking: Reported on 05/10/2015 06/14/13   Lorre Munroeegina W Baity, NP  gabapentin (NEURONTIN) 100 MG capsule Take 100 mg by mouth 3 (three) times daily.    Historical Provider, MD  loperamide (IMODIUM) 2 MG capsule Take 1 capsule (2 mg total) by mouth 4 (four) times daily as needed for diarrhea or loose stools. Patient not taking: Reported on 05/10/2015 10/27/12   Vanetta MuldersScott Zackowski, MD  promethazine (PHENERGAN) 25 MG tablet Take 1 tablet (25 mg total) by mouth every 6 (six) hours as needed for nausea. Patient not taking: Reported on 05/10/2015 08/23/13   Dianne Dunalia M Aron, MD  sertraline (ZOLOFT) 50 MG  tablet Take 1 tablet (50 mg total) by mouth daily. Patient not taking: Reported on 05/10/2015 07/25/13   Dianne Dun, MD  topiramate (TOPAMAX) 50 MG tablet Take 1 tablet (50 mg total) by mouth daily. Patient not taking: Reported on 05/10/2015 10/02/12   Dianne Dun, MD  traZODone (DESYREL) 150 MG tablet Take by mouth at bedtime.    Historical Provider, MD   BP 116/68 mmHg  Pulse 89  Temp(Src) 97.8 F (36.6 C) (Oral)  SpO2 99%  LMP 03/31/2015 (Approximate) Physical Exam  Constitutional: She is oriented to person, place,  and time. She appears well-developed and well-nourished. No distress.  HENT:  Head: Normocephalic and atraumatic.  Mouth/Throat: Oropharynx is clear and moist.  Eyes: Conjunctivae and EOM are normal. Pupils are equal, round, and reactive to light.  Neck: Normal range of motion. Neck supple.  Cardiovascular: Normal rate, regular rhythm and intact distal pulses.   No murmur heard. Pulmonary/Chest: Effort normal and breath sounds normal. No respiratory distress. She has no wheezes. She has no rales.  Abdominal: Soft. She exhibits no distension. There is tenderness in the suprapubic area. There is no rebound, no guarding and no CVA tenderness.  Genitourinary: Vagina normal. Uterus is enlarged. Cervix exhibits no motion tenderness, no discharge and no friability. Right adnexum displays no mass, no tenderness and no fullness. Left adnexum displays no mass, no tenderness and no fullness.  Musculoskeletal: Normal range of motion. She exhibits no edema or tenderness.  Neurological: She is alert and oriented to person, place, and time.  Skin: Skin is warm and dry. No rash noted. No erythema.  Psychiatric: She has a normal mood and affect. Her behavior is normal.  Nursing note and vitals reviewed.   ED Course  Procedures (including critical care time) Labs Review Labs Reviewed  WET PREP, GENITAL - Abnormal; Notable for the following:    WBC, Wet Prep HPF POC MODERATE (*)    All other components within normal limits  URINALYSIS, ROUTINE W REFLEX MICROSCOPIC (NOT AT [facility]) - Abnormal; Notable for the following:    APPearance CLOUDY (*)    Ketones, ur 15 (*)    All other components within normal limits  I-STAT BETA HCG BLOOD, ED ([facility], [facility], [facility] ONLY) - Abnormal; Notable for the following:    I-stat hCG, quantitative >2000.0 (*)    All other components within normal limits  RAPID HIV SCREEN (HIV 1/2 AB+AG)  HIV ANTIBODY (ROUTINE TESTING)  RPR  HCG, QUANTITATIVE, PREGNANCY  GC/CHLAMYDIA PROBE AMP  () NOT AT [facility]    Imaging Review US Ob Comp Less 14 Wks  05/10/2015  CLINICAL DATA:  Abdominal cramping. EXAM: OBSTETRIC <14 WK [place] AND TRANSVAGINAL OB US TECHNIQUE: Both transabdominal and transvaginal ultrasound examinations were performed for complete evaluation of the gestation as well as the maternal uterus, adnexal regions, and pelvic cul-de-sac. Transvaginal technique was performed to assess early pregnancy. COMPARISON:  None. FINDINGS: Intrauterine gestational sac: Visualized Yolk sac:  Visualized Embryo:  Not visualized MSD: 4.6  mm   5 w   2  d Maternal uterus/adnexae: The right ovary is normal in size and appearance measuring approximately 4.0 x 2.7 x 3.6 cm. No discrete right ovarian or adnexal lesions. The left ovary is normal in size and appearance measuring approximately 3.4 x 2.3 x 3.2 cm. Note is made of a slightly ill-defined approximately 1.3 x 1.8 x 1.6 cm suspected corpus luteal cyst within the left ovary. Note is also made of several small  peripheral follicles. There is a moderate amount of anechoic free fluid within the pelvic cul-de-sac (image 54 and 55). IMPRESSION: 1. Punctate cystic structure within the endometrial canal with potential yolk sac is favored to represent a gestational sac with mean sac diameter compatible with a 5 weeks 2 days gestation. Alternatively, this cystic structure could represent a pseudo gestational sac in the setting of an ectopic pregnancy and as such, further evaluation with serial beta HCG and follow-up pelvic ultrasound is recommended. 2. Ill-defined approximately 1.8 cm suspected left-sided corpus luteal cyst. 3. Moderate amount of free fluid within the pelvic cul-de-sac. Electronically Signed   By: Simonne Come M.D.   On: 05/10/2015 12:49   US Ob Transvaginal  05/10/2015  CLINICAL DATA:  Abdominal cramping. EXAM: OBSTETRIC <14 WK [place] AND TRANSVAGINAL OB US TECHNIQUE: Both transabdominal and transvaginal ultrasound examinations were performed  for complete evaluation of the gestation as well as the maternal uterus, adnexal regions, and pelvic cul-de-sac. Transvaginal technique was performed to assess early pregnancy. COMPARISON:  None. FINDINGS: Intrauterine gestational sac: Visualized Yolk sac:  Visualized Embryo:  Not visualized MSD: 4.6  mm   5 w   2  d Maternal uterus/adnexae: The right ovary is normal in size and appearance measuring approximately 4.0 x 2.7 x 3.6 cm. No discrete right ovarian or adnexal lesions. The left ovary is normal in size and appearance measuring approximately 3.4 x 2.3 x 3.2 cm. Note is made of a slightly ill-defined approximately 1.3 x 1.8 x 1.6 cm suspected corpus luteal cyst within the left ovary. Note is also made of several small peripheral follicles. There is a moderate amount of anechoic free fluid within the pelvic cul-de-sac (image 54 and 55). IMPRESSION: 1. Punctate cystic structure within the endometrial canal with potential yolk sac is favored to represent a gestational sac with mean sac diameter compatible with a 5 weeks 2 days gestation. Alternatively, this cystic structure could represent a pseudo gestational sac in the setting of an ectopic pregnancy and as such, further evaluation with serial beta HCG and follow-up pelvic ultrasound is recommended. 2. Ill-defined approximately 1.8 cm suspected left-sided corpus luteal cyst. 3. Moderate amount of free fluid within the pelvic cul-de-sac. Electronically Signed   By: Simonne Come M.D.   On: 05/10/2015 12:49   I have personally reviewed and evaluated these images and lab results as part of my medical decision-making.   EKG Interpretation None      MDM   Final diagnoses:  Pregnancy, first, first trimester  Abdominal pain, unspecified abdominal location    Patient is a 24 year old G3 P2101 presents today with a confirmed pregnancy test at the [facility] approximately 5 and half weeks pregnant with lower abdominal pelvic pain without bleeding or  discharge.  Patient is currently only sexually active with one partner who she has been with for 5 months. She denies any prior history of sexual transmitted infection and does have an O- blood received program with her pregnancies in the past. Her first pregnancy ended in delivery at 21 weeks of death of fetus for incompetent cervix. Second pregnancy went to full-term after getting a cerclage at 12 weeks. She denies ever having similar pain as she is having now. The pain is been intermittent for the last week. She was sexually active last night which was uncomfortable.  On exam patient has lower abdominal tenderness without significant cervical motion or adnexal tenderness on exam. No evidence of discharge or bleeding.  Wet prep with moderate white  blood cells but otherwise within normal limits. UA, HIV, RPR, hCG, GC chlamydia and trans-vaginal ultrasound pending  2:00 PM UA without signs of infection. HCG greater than 2000 and wet prep with moderate white blood cells. Formal hCG pending. Ultrasound findings show a punctate cystic structure within the endometrial canal which could represent a gestational sac compatible 5 weeks 2 days gestation however state a could also be a pseudo-gestational sac in the setting of an ectopic pregnancy. Patient has an ill-defined 1.8 cm left-sided corpus luteal cyst and moderate amount of free fluid. Will discuss findings with Dr. Emelda Fear with OB/GYN on call today for further recommendations.  2:24 PM [facility]  Gwyneth Sprout, MD 05/10/15 1425  Gwyneth Sprout, MD 05/10/15 1426

## 2015-05-11 LAB — HIV ANTIBODY (ROUTINE TESTING W REFLEX): HIV Screen 4th Generation wRfx: NONREACTIVE

## 2015-05-11 LAB — RPR: RPR Ser Ql: NONREACTIVE

## 2015-05-12 ENCOUNTER — Encounter (HOSPITAL_COMMUNITY): Payer: Self-pay | Admitting: *Deleted

## 2015-05-12 ENCOUNTER — Inpatient Hospital Stay (HOSPITAL_COMMUNITY)
Admission: AD | Admit: 2015-05-12 | Discharge: 2015-05-12 | Disposition: A | Discharge status: Home / Self Care | Payer: Medicaid Other | Source: Ambulatory Visit | Attending: Family Medicine | Admitting: Family Medicine

## 2015-05-12 DIAGNOSIS — F17210 Nicotine dependence, cigarettes, uncomplicated: Secondary | ICD-10-CM | POA: Insufficient documentation

## 2015-05-12 DIAGNOSIS — O26891 Other specified pregnancy related conditions, first trimester: Secondary | ICD-10-CM | POA: Diagnosis present

## 2015-05-12 DIAGNOSIS — O99331 Smoking (tobacco) complicating pregnancy, first trimester: Secondary | ICD-10-CM | POA: Diagnosis not present

## 2015-05-12 DIAGNOSIS — R109 Unspecified abdominal pain: Secondary | ICD-10-CM | POA: Diagnosis not present

## 2015-05-12 DIAGNOSIS — Z3A01 Less than 8 weeks gestation of pregnancy: Secondary | ICD-10-CM | POA: Insufficient documentation

## 2015-05-12 DIAGNOSIS — O26899 Other specified pregnancy related conditions, unspecified trimester: Secondary | ICD-10-CM

## 2015-05-12 DIAGNOSIS — O9989 Other specified diseases and conditions complicating pregnancy, childbirth and the puerperium: Secondary | ICD-10-CM

## 2015-05-12 LAB — HCG, QUANTITATIVE, PREGNANCY: hCG, Beta Chain, Quant, S: 6237 m[IU]/mL — ABNORMAL HIGH (ref <5)

## 2015-05-12 LAB — GC/CHLAMYDIA PROBE AMP (~~LOC~~) NOT AT ARMC
Chlamydia: NEGATIVE
Neisseria Gonorrhea: NEGATIVE

## 2015-05-12 NOTE — MAU Note (Addendum)
Patient presents at [redacted] weeks gestation for repeat bloodwork. Denies complications.

## 2015-05-12 NOTE — Discharge Instructions (Signed)
Abdominal Pain During Pregnancy Abdominal pain is common in pregnancy. Most of the time, it does not cause harm. There are many causes of abdominal pain. Some causes are more serious than others. Some of the causes of abdominal pain in pregnancy are easily diagnosed. Occasionally, the diagnosis takes time to understand. Other times, the cause is not determined. Abdominal pain can be a sign that something is very wrong with the pregnancy, or the pain may have nothing to do with the pregnancy at all. For this reason, always tell your health care provider if you have any abdominal discomfort. HOME CARE INSTRUCTIONS  Monitor your abdominal pain for any changes. The following actions may help to alleviate any discomfort you are experiencing:  Do not have sexual intercourse or put anything in your vagina until your symptoms go away completely.  Get plenty of rest until your pain improves.  Drink clear fluids if you feel nauseous. Avoid solid food as long as you are uncomfortable or nauseous.  Only take over-the-counter or prescription medicine as directed by your health care provider.  Keep all follow-up appointments with your health care provider. SEEK IMMEDIATE MEDICAL CARE IF:  You are bleeding, leaking fluid, or passing tissue from the vagina.  You have increasing pain or cramping.  You have persistent vomiting.  You have painful or bloody urination.  You have a fever.  You notice a decrease in your baby's movements.  You have extreme weakness or feel faint.  You have shortness of breath, with or without abdominal pain.  You develop a severe headache with abdominal pain.  You have abnormal vaginal discharge with abdominal pain.  You have persistent diarrhea.  You have abdominal pain that continues even after rest, or gets worse. MAKE SURE YOU:   Understand these instructions.  Will watch your condition.  Will get help right away if you are not doing well or get worse.     This information is not intended to replace advice given to you by your health care provider. Make sure you discuss any questions you have with your health care provider.   Document Released: 06/21/2005 Document Revised: 04/11/2013 Document Reviewed: 01/18/2013 Elsevier Interactive Patient Education [employer]    First Trimester of Pregnancy The first trimester of pregnancy is from week 1 until the end of week 12 (months 1 through 3). During this time, your baby will begin to develop inside you. At 6-8 weeks, the eyes and face are formed, and the heartbeat can be seen on ultrasound. At the end of 12 weeks, all the baby's organs are formed. Prenatal care is all the medical care you receive before the birth of your baby. Make sure you get good prenatal care and follow all of your doctor's instructions. HOME CARE  Medicines  Take medicine only as told by your doctor. Some medicines are safe and some are not during pregnancy.  Take your prenatal vitamins as told by your doctor.  Take medicine that helps you poop (stool softener) as needed if your doctor says it is okay. Diet  Eat regular, healthy meals.  Your doctor will tell you the amount of weight gain that is right for you.  Avoid raw meat and uncooked cheese.  If you feel sick to your stomach (nauseous) or throw up (vomit):  Eat 4 or 5 small meals a day instead of 3 large meals.  Try eating a few soda crackers.  Drink liquids between meals instead of during meals.  If you have  a hard time pooping (constipation):  Eat high-fiber foods like fresh vegetables, fruit, and whole grains.  Drink enough fluids to keep your pee (urine) clear or pale yellow. Activity and Exercise  Exercise only as told by your doctor. Stop exercising if you have cramps or pain in your lower belly (abdomen) or low back.  Try to avoid standing for long periods of time. Move your legs often if you must stand in one place for a long  time.  Avoid heavy lifting.  Wear low-heeled shoes. Sit and stand up straight.  You can have sex unless your doctor tells you not to. Relief of Pain or Discomfort  Wear a good support bra if your breasts are sore.  Take warm water baths (sitz baths) to soothe pain or discomfort caused by hemorrhoids. Use hemorrhoid cream if your doctor says it is okay.  Rest with your legs raised if you have leg cramps or low back pain.  Wear support hose if you have puffy, bulging veins (varicose veins) in your legs. Raise (elevate) your feet for 15 minutes, 3-4 times a day. Limit salt in your diet. Prenatal Care  Schedule your prenatal visits by the twelfth week of pregnancy.  Write down your questions. Take them to your prenatal visits.  Keep all your prenatal visits as told by your doctor. Safety  Wear your seat belt at all times when driving.  Make a list of emergency phone numbers. The list should include numbers for family, friends, the hospital, and police and fire departments. General Tips  Ask your doctor for a referral to a local prenatal class. Begin classes no later than at the start of month 6 of your pregnancy.  Ask for help if you need counseling or help with nutrition. Your doctor can give you advice or tell you where to go for help.  Do not use hot tubs, steam rooms, or saunas.  Do not douche or use tampons or scented sanitary pads.  Do not cross your legs for long periods of time.  Avoid litter boxes and soil used by cats.  Avoid all smoking, herbs, and alcohol. Avoid drugs not approved by your doctor.  Do not use any tobacco products, including cigarettes, chewing tobacco, and electronic cigarettes. If you need help quitting, ask your doctor. You may get counseling or other support to help you quit.  Visit your dentist. At home, brush your teeth with a soft toothbrush. Be gentle when you floss. GET HELP IF:  You are dizzy.  You have mild cramps or pressure in  your lower belly.  You have a nagging pain in your belly area.  You continue to feel sick to your stomach, throw up, or have watery poop (diarrhea).  You have a bad smelling fluid coming from your vagina.  You have pain with peeing (urination).  You have increased puffiness (swelling) in your face, hands, legs, or ankles. GET HELP RIGHT AWAY IF:   You have a fever.  You are leaking fluid from your vagina.  You have spotting or bleeding from your vagina.  You have very bad belly cramping or pain.  You gain or lose weight rapidly.  You throw up blood. It may look like coffee grounds.  You are around people who have [place] measles, fifth disease, or chickenpox.  You have a very bad headache.  You have shortness of breath.  You have any kind of trauma, such as from a fall or a car accident.   This information  is not intended to replace advice given to you by your health care provider. Make sure you discuss any questions you have with your health care provider.   Document Released: 12/08/2007 Document Revised: 07/12/2014 Document Reviewed: 05/01/2013 Elsevier Interactive Patient Education [employer]

## 2015-05-12 NOTE — MAU Provider Note (Signed)
History   [id number]   Chief Complaint  Patient presents with  . Repeat Bloodwork     HPI Dana Simpson is a 24 y.o. female G1P0 here for follow-up BHCG.  Upon review of the records patient was first seen at [facility] on 11/5 for abdominal pain.   BHCG on that day was 3371.  Ultrasound showed possible IUGS with yolk sac vs pseudosac.  GC/CT and wet prep were collected.  Results were negative. Pt discharged home to return to MAU for f/u BHCG.   Pt here today with no report of abdominal pain or vaginal bleeding.   All other systems negative.    Patient's last menstrual period was 03/31/2015 (approximate).  OB History  Gravida Para Term Preterm AB SAB TAB Ectopic Multiple Living  1             # Outcome Date GA Lbr Len/2nd Weight Sex Delivery Anes PTL Lv  1 Current               Past Medical History  Diagnosis Date  . Depression   . Migraines     No family history on file.  Social History   Social History  . Marital Status: Single    Spouse Name: N/A  . Number of Children: N/A  . Years of Education: N/A   Social History Main Topics  . Smoking status: Current Every Day Smoker -- 0.25 packs/day    Types: Cigarettes    Start date: 06/04/2012  . Smokeless tobacco: Not on file  . Alcohol Use: Yes  . Drug Use: No  . Sexual Activity: Not on file   Other Topics Concern  . Not on file   Social History Narrative    Allergies  Allergen Reactions  . Lamictal [Lamotrigine] Rash    Trudie Buckler syndrome     No current facility-administered medications on file prior to encounter.   Current Outpatient Prescriptions on File Prior to Encounter  Medication Sig Dispense Refill  . cyclobenzaprine (FLEXERIL) 10 MG tablet Take 1 tablet (10 mg total) by mouth 3 (three) times daily as needed for muscle spasms. (Patient not taking: Reported on 05/10/2015) 30 tablet 0  . ergocalciferol (VITAMIN D2) 50000 UNITS capsule Take one by mouth weekly x 6 weeks. (Patient not taking:  Reported on 05/10/2015) 6 capsule 0  . etonogestrel (IMPLANON) 68 MG IMPL implant Inject 1 each (68 mg total) into the skin once. (Patient not taking: Reported on 05/10/2015) 1 each 0  . ezetimibe (ZETIA) 10 MG tablet Take 1 tablet (10 mg total) by mouth daily. (Patient not taking: Reported on 05/10/2015) 30 tablet 2  . gabapentin (NEURONTIN) 100 MG capsule Take 100 mg by mouth 3 (three) times daily.    Marland Kitchen loperamide (IMODIUM) 2 MG capsule Take 1 capsule (2 mg total) by mouth 4 (four) times daily as needed for diarrhea or loose stools. (Patient not taking: Reported on 05/10/2015) 12 capsule 0  . Prenatal Vit-Fe Fumarate-FA (PRENATAL MULTIVITAMIN) TABS tablet Take 1 tablet by mouth daily at 12 noon.    . promethazine (PHENERGAN) 25 MG tablet Take 1 tablet (25 mg total) by mouth every 6 (six) hours as needed for nausea. (Patient not taking: Reported on 05/10/2015) 12 tablet 0  . sertraline (ZOLOFT) 50 MG tablet Take 1 tablet (50 mg total) by mouth daily. (Patient not taking: Reported on 05/10/2015) 30 tablet 0  . topiramate (TOPAMAX) 50 MG tablet Take 1 tablet (50 mg total) by mouth daily. (Patient  not taking: Reported on 05/10/2015) 30 tablet 11  . traZODone (DESYREL) 150 MG tablet Take by mouth at bedtime.       Physical Exam   Filed Vitals:   05/12/15 1547  BP: 120/62  Pulse: 69  Temp: 98.1 F (36.7 C)  TempSrc: Oral  Resp: 18  Height: 5\' 7"  (1.702 m)  Weight: 210 lb 8 oz (95.482 kg)    Physical Exam  Constitutional: She appears well-developed and well-nourished. No distress.  HENT:  Head: Normocephalic and atraumatic.  Cardiovascular: Normal rate.   Respiratory: Effort normal. No respiratory distress.  Skin: She is not diaphoretic.  Psychiatric: She has a normal mood and affect. Her behavior is normal. Judgment and thought content normal.    MAU Course  Procedures Component     Latest Ref Rng 05/10/2015 05/12/2015  HCG, Beta Chain, Quant, S     <5 mIU/mL 3371 (H) 6237 (H)    MDM BHCG rose 85% in 48 hours. Appropriate rise, but per radiologist recommendation will have patient f/u for additional BHCG in 48 hrs.   Assessment and Plan  24 y.o. G1P0 at 6623w0d wks Pregnancy Follow-up BHCG Pregnancy of Unknown Location 1. Abdominal pain affecting pregnancy    P: Discharge home F/u in 48 hrs for BHCG Discussed reasons to return to MAU  Judeth HornErin Wania Longstreth, NP 05/12/2015 4:43 PM

## 2015-05-14 ENCOUNTER — Inpatient Hospital Stay (HOSPITAL_COMMUNITY)
Admission: AD | Admit: 2015-05-14 | Discharge: 2015-05-14 | Disposition: A | Discharge status: Home / Self Care | Payer: Medicaid Other | Source: Ambulatory Visit | Attending: Family Medicine | Admitting: Family Medicine

## 2015-05-14 DIAGNOSIS — O26891 Other specified pregnancy related conditions, first trimester: Secondary | ICD-10-CM | POA: Diagnosis not present

## 2015-05-14 DIAGNOSIS — O9989 Other specified diseases and conditions complicating pregnancy, childbirth and the puerperium: Secondary | ICD-10-CM | POA: Diagnosis not present

## 2015-05-14 DIAGNOSIS — Z3A01 Less than 8 weeks gestation of pregnancy: Secondary | ICD-10-CM | POA: Insufficient documentation

## 2015-05-14 DIAGNOSIS — R109 Unspecified abdominal pain: Secondary | ICD-10-CM

## 2015-05-14 DIAGNOSIS — O26899 Other specified pregnancy related conditions, unspecified trimester: Secondary | ICD-10-CM

## 2015-05-14 DIAGNOSIS — O3680X0 Pregnancy with inconclusive fetal viability, not applicable or unspecified: Secondary | ICD-10-CM

## 2015-05-14 LAB — HCG, QUANTITATIVE, PREGNANCY: hCG, Beta Chain, Quant, S: 10798 m[IU]/mL — ABNORMAL HIGH (ref <5)

## 2015-05-14 NOTE — Discharge Instructions (Signed)

## 2015-05-14 NOTE — MAU Note (Signed)
Not in lobby when called 

## 2015-05-14 NOTE — MAU Provider Note (Signed)
Chief Complaint  Patient presents with  . Labs Only  Original complaint was abdominal pain in early pregnancy.   Subjective:   Pt is a 24 y.o. G1P0 here for follow-up BHCG.  Upon review of the records patient was first seen on 05/10/15 for abdominal pain in early pregnancy.   BHCG on that day was >2000.  Ultrasound showed IUGS and yolk sac.  GC/CT and wet prep were collected.  Results were Negative/Negative.   Pt discharged home stable and referred to MAU.   She was seen on 05/12/15  Pt here today with no report of abdominal pain or vaginal bleeding.   All other systems negative.  Last seen in MAU on 05/12/15.      BHCG was 6237.    Past Medical History  Diagnosis Date  . Depression   . Migraines     OB History  Gravida Para Term Preterm AB SAB TAB Ectopic Multiple Living  1             # Outcome Date GA Lbr Len/2nd Weight Sex Delivery Anes PTL Lv  1 Current               No family history on file.  Objective: Physical Exam  Filed Vitals:   05/14/15 1707  BP: 121/56  Pulse: 103  Temp: 98 F (36.7 C)  Resp: 18   Constitutional: She is oriented to person, place, and time. She appears well-developed and well-nourished. No distress.  Pulmonary/Chest: Effort normal. No respiratory distress.  Musculoskeletal: Normal range of motion.  Neurological: She is alert and oriented to person, place, and time.  Skin: Skin is warm and dry.  Pelvic exam not indicated  Results for Dana Simpson, Dana Simpson (MRN [id number]) as of 05/14/2015 19:24  Ref. Range 05/12/2015 15:43 05/14/2015 16:05  HCG, Beta Chain, Quant, S Latest Ref Range: <5 mIU/mL 6237 (H) 10798 (H)    Assessment: 24 y.o. G1P0 at 5261w2d wks Pregnancy Follow-up BHCG Abdominal pain improved  Plan: Will have repeat US done either late this week or early next week To confirm IUP. Follow up after that for prenatal care

## 2015-05-14 NOTE — MAU Note (Signed)
Pt presents to MAU for repeat  BHCG. Denies any vaginal bleeding or pain 

## 2015-05-19 ENCOUNTER — Ambulatory Visit (HOSPITAL_COMMUNITY)
Admission: RE | Admit: 2015-05-19 | Discharge: 2015-05-19 | Disposition: A | Discharge status: Home / Self Care | Payer: Medicaid Other | Source: Ambulatory Visit | Attending: Advanced Practice Midwife | Admitting: Advanced Practice Midwife

## 2015-05-19 ENCOUNTER — Inpatient Hospital Stay (HOSPITAL_COMMUNITY)
Admission: AD | Admit: 2015-05-19 | Discharge: 2015-05-19 | Disposition: A | Discharge status: Home / Self Care | Payer: Medicaid Other | Source: Ambulatory Visit | Attending: Obstetrics & Gynecology | Admitting: Obstetrics & Gynecology

## 2015-05-19 DIAGNOSIS — Z36 Encounter for antenatal screening of mother: Secondary | ICD-10-CM | POA: Diagnosis present

## 2015-05-19 DIAGNOSIS — Z09 Encounter for follow-up examination after completed treatment for conditions other than malignant neoplasm: Secondary | ICD-10-CM

## 2015-05-19 DIAGNOSIS — Z3A01 Less than 8 weeks gestation of pregnancy: Secondary | ICD-10-CM | POA: Insufficient documentation

## 2015-05-19 DIAGNOSIS — O3680X0 Pregnancy with inconclusive fetal viability, not applicable or unspecified: Secondary | ICD-10-CM

## 2015-05-19 NOTE — MAU Provider Note (Signed)
  Ms. Dana Simpson is a 24 y.o. female G1P0 at 4972w0d presenting for a follow up [place].   [place] Ob Transvaginal  05/19/2015  CLINICAL DATA:  24 year old pregnant female presents for follow-up of possible intrauterine gestation on recent sonogram. No acute symptoms are reported. EDC by LMP: 01/05/2016, projecting to an expected gestational age of [redacted] weeks 0 days. EXAM: TRANSVAGINAL OB ULTRASOUND TECHNIQUE: Transvaginal ultrasound was performed for complete evaluation of the gestation as well as the maternal uterus, adnexal regions, and pelvic cul-de-sac. COMPARISON:  05/10/2015 obstetric scan. FINDINGS: Intrauterine gestational sac: There is a single intrauterine gestational sac, which appears normal in size, shape and position. Yolk sac:  Present and normal. Embryo:  Present . Cardiac Activity: Regular rate and rhythm Heart Rate: 110 bpm CRL:   5.2  mm   6 w 1 d                  US EDC: 01/11/2016 Maternal uterus/adnexae: Maternal left ovary measures 4.0 x 1.5 x 1.9 cm. Maternal right ovary measures 3.9 x 2.7 x 2.9 cm. There is a corpus luteum in the right ovary. No suspicious ovarian or adnexal mass. No abnormal free fluid in the pelvis. No uterine fibroids. No perigestational bleed. IMPRESSION: 1. Single living intrauterine gestation at 6 weeks 1 day by crown-rump length, with no significant discrepancy with the expected gestational age of [redacted] weeks 0 days by provided menstrual dating. 2. No first trimester gestational abnormality. Normal embryonic cardiac activity. 3. No maternal ovarian or adnexal abnormality. Electronically Signed   By: Delbert PhenixJason A Poff M.D.   On: 05/19/2015 12:10    SIUP @ 1940w1d Patient without complaints.  Picture provided    Duane LopeJennifer I Rasch, NP 05/19/2015 12:32 PM

## 2015-07-02 HISTORY — PX: CERVICAL CERCLAGE: SHX1329

## 2015-07-30 ENCOUNTER — Other Ambulatory Visit: Payer: Self-pay | Admitting: Advanced Practice Midwife

## 2015-07-30 ENCOUNTER — Encounter (HOSPITAL_COMMUNITY): Payer: Self-pay

## 2015-07-30 ENCOUNTER — Inpatient Hospital Stay (HOSPITAL_COMMUNITY)
Admission: AD | Admit: 2015-07-30 | Discharge: 2015-07-30 | Disposition: A | Discharge status: Home / Self Care | Payer: Medicaid Other | Source: Ambulatory Visit | Attending: Obstetrics & Gynecology | Admitting: Obstetrics & Gynecology

## 2015-07-30 DIAGNOSIS — J029 Acute pharyngitis, unspecified: Secondary | ICD-10-CM

## 2015-07-30 DIAGNOSIS — O9989 Other specified diseases and conditions complicating pregnancy, childbirth and the puerperium: Secondary | ICD-10-CM

## 2015-07-30 DIAGNOSIS — O26891 Other specified pregnancy related conditions, first trimester: Secondary | ICD-10-CM | POA: Insufficient documentation

## 2015-07-30 DIAGNOSIS — J101 Influenza due to other identified influenza virus with other respiratory manifestations: Secondary | ICD-10-CM | POA: Diagnosis not present

## 2015-07-30 DIAGNOSIS — Z3A17 17 weeks gestation of pregnancy: Secondary | ICD-10-CM | POA: Diagnosis not present

## 2015-07-30 DIAGNOSIS — Z87891 Personal history of nicotine dependence: Secondary | ICD-10-CM | POA: Insufficient documentation

## 2015-07-30 DIAGNOSIS — J09X2 Influenza due to identified novel influenza A virus with other respiratory manifestations: Secondary | ICD-10-CM | POA: Diagnosis not present

## 2015-07-30 HISTORY — DX: Other reaction to spinal and lumbar puncture: G97.1

## 2015-07-30 LAB — URINALYSIS, ROUTINE W REFLEX MICROSCOPIC
Bilirubin Urine: NEGATIVE
Glucose, UA: NEGATIVE mg/dL
Hgb urine dipstick: NEGATIVE
KETONES UR: NEGATIVE mg/dL
LEUKOCYTES UA: NEGATIVE
NITRITE: NEGATIVE
PH: 5.5 (ref 5.0–8.0)
PROTEIN: NEGATIVE mg/dL
Specific Gravity, Urine: >1.03 — ABNORMAL HIGH (ref 1.005–1.030)

## 2015-07-30 LAB — INFLUENZA PANEL BY PCR (TYPE A & B)
H1N1FLUPCR: NOT DETECTED
INFLBPCR: NEGATIVE
Influenza A By PCR: POSITIVE — AB

## 2015-07-30 MED ORDER — OSELTAMIVIR PHOSPHATE 75 MG PO CAPS: 75.0000 mg | ORAL_CAPSULE | Freq: Two times a day (BID) | ORAL | Status: AC

## 2015-07-30 MED ORDER — ALBUTEROL SULFATE HFA 108 (90 BASE) MCG/ACT IN AERS: 2.0000 | INHALATION_SPRAY | Freq: Once | RESPIRATORY_TRACT | Status: DC

## 2015-07-30 MED ORDER — ALBUTEROL SULFATE HFA 108 (90 BASE) MCG/ACT IN AERS
2.0000 | INHALATION_SPRAY | RESPIRATORY_TRACT | Status: DC | PRN
  Administered 2015-07-30: 2 via RESPIRATORY_TRACT
  Filled 2015-07-30: qty 6.7

## 2015-07-30 MED ORDER — BENZONATATE 100 MG PO CAPS: 100.0000 mg | ORAL_CAPSULE | Freq: Three times a day (TID) | ORAL | Status: DC

## 2015-07-30 MED ORDER — ALBUTEROL SULFATE (2.5 MG/3ML) 0.083% IN NEBU: 3.0000 mL | INHALATION_SOLUTION | Freq: Once | RESPIRATORY_TRACT | Status: DC

## 2015-07-30 MED ORDER — AZITHROMYCIN 250 MG PO TABS: ORAL_TABLET | ORAL | Status: DC

## 2015-07-30 NOTE — MAU Provider Note (Signed)
CSN: [id number]     Arrival date & time 07/30/15  1155 History   None    No chief complaint on file.    (Consider location/radiation/quality/duration/timing/severity/associated sxs/prior Treatment) Patient is a 25 y.o. female presenting with URI. The history is provided by the patient. No language interpreter was used.  URI The primary symptoms include fever, headaches, sore throat, cough and myalgias. Primary symptoms do not include ear pain, wheezing, abdominal pain, nausea, vomiting or rash. The current episode started yesterday. This is a new problem.  The headache is not associated with eye pain.  Symptoms associated with the illness include chills, sinus pressure and congestion.   Dana Simpson is a 25 y.o. [phone] @ [redacted]w[redacted]d gestation who presents to  MAU with cough, congestion, fever, chills and aching all over that started last night. The cough is productive with yellow green sputum. The patient reports hx of smoking 1ppd until 17 weeks ago when she found out she was pregnant and stopped smoking. She reports being exposed to her mother who has similar respiratory symptoms. The patient has not taken any medications due to being pregnant.   Past Medical History  Diagnosis Date  . Depression   . Migraines   . Spinal headache     after ceclage with second pregnancy   Past Surgical History  Procedure Laterality Date  . Tonsillectomy    . Adenoidectomy    . Cesarean section    . Cerclage removal    . Cervical cerclage  07/02/15   Family History  Problem Relation Age of Onset  . Diabetes Father   . Diabetes Paternal Aunt   . Diabetes Paternal Grandmother    Social History  Substance Use Topics  . Smoking status: Former Smoker -- 0.25 packs/day    Types: Cigarettes    Start date: 06/04/2012    Quit date: 06/07/2015  . Smokeless tobacco: None  . Alcohol Use: No   OB History    Gravida Para Term Preterm AB TAB SAB Ectopic Multiple Living   0 0 0 0 0 2      Review of Systems  Constitutional: Positive for fever and chills.  HENT: Positive for congestion, postnasal drip, sinus pressure, sneezing and sore throat. Negative for ear discharge, ear pain and facial swelling.   Eyes: Negative for pain, redness, itching and visual disturbance.  Respiratory: Positive for cough. Negative for chest tightness, shortness of breath and wheezing.   Cardiovascular: Negative for chest pain.  Gastrointestinal: Negative for nausea, vomiting and abdominal pain.  Genitourinary: Negative for dysuria, frequency, vaginal bleeding and pelvic pain.  Musculoskeletal: Positive for myalgias.  Skin: Negative for rash and wound.  Neurological: Positive for headaches. Negative for dizziness and syncope.  Psychiatric/Behavioral: Negative for confusion. The patient is not nervous/anxious.       Allergies  Lamictal  Home Medications   Prior to Admission medications   Medication Sig Start Date End Date Taking? Authorizing Provider  Prenatal Vit-Fe Fumarate-FA (PRENATAL MULTIVITAMIN) TABS tablet Take 1 tablet by mouth daily at 12 noon.   Yes Historical Provider, MD  azithromycin (ZITHROMAX Z-PAK) 250 MG tablet Take 2 tables PO today and then one tablet daily 07/30/15   [place] Orlene Och, NP  benzonatate (TESSALON) 100 MG capsule Take 1 capsule (100 mg total) by mouth every 8 (eight) hours. 07/30/15   [place] Orlene Och, NP   BP 121/78 mmHg  Pulse 103  Temp(Src) 98 F (36.7 C) (Oral)  Resp 17  Wt 214 lb 12.8 oz (97.433 kg)  SpO2 97%  LMP 03/31/2015 (Approximate) Physical Exam  Constitutional: She is oriented to person, place, and time. She appears well-developed and well-nourished. No distress.  HENT:  Head: Normocephalic and atraumatic.  Right Ear: Tympanic membrane normal.  Left Ear: Tympanic membrane normal.  Nose: Mucosal edema and rhinorrhea present.  Mouth/Throat: Uvula is midline and mucous membranes are normal. Posterior oropharyngeal erythema present. No  oropharyngeal exudate or posterior oropharyngeal edema.  Eyes: Conjunctivae and EOM are normal. Pupils are equal, round, and reactive to light.  Neck: Normal range of motion. Neck supple.  Cardiovascular: Regular rhythm.  Tachycardia present.   Pulmonary/Chest: Effort normal. No respiratory distress. She has no rales.  Abdominal: Soft. Bowel sounds are normal. There is no tenderness.  Musculoskeletal: Normal range of motion.  Lymphadenopathy:    She has no cervical adenopathy.  Neurological: She is alert and oriented to person, place, and time. No cranial nerve deficit.  Skin: Skin is warm and dry.  Psychiatric: She has a normal mood and affect. Her behavior is normal.  Nursing note and vitals reviewed.   ED Course  Procedures (including critical care time) U/A, influenza screen Imaging Review No results found. I have personally reviewed the lab results as part of my medical decision-making.   MDM  25 y.o. female with productive cough, fever, sore throat that started yesterday stable for d/c without respiratory distress, no fever at this time and O2 SAT 97% on R/A. Will treat for bronchitis. She will follow up with her PCP or return here for worsening symptoms. Discussed with the patient and all questioned fully answered.   Influenza screening had to be sent to [facility] and estimated 5 hours before results would be in so patient d/c and will call with results.  Final diagnoses:  Acute pharyngitis, unspecified etiology  Influenza due to identified novel influenza A virus with other respiratory manifestations    8:22 pm Influenza positive, will call Tamiflu into patient's pharmacy.   Results for orders placed or performed during the hospital encounter of 07/30/15 (from the past 24 hour(s))  Urinalysis, Routine w reflex microscopic (not at [facility])     Status: Abnormal   Collection Time: 07/30/15 12:25 PM  Result Value Ref Range   Color, Urine YELLOW YELLOW   APPearance HAZY (A) CLEAR    Specific Gravity, Urine >1.030 (H) 1.005 - 1.030   pH 5.5 5.0 - 8.0   Glucose, UA NEGATIVE NEGATIVE mg/dL   Hgb urine dipstick NEGATIVE NEGATIVE   Bilirubin Urine NEGATIVE NEGATIVE   Ketones, ur NEGATIVE NEGATIVE mg/dL   Protein, ur NEGATIVE NEGATIVE mg/dL   Nitrite NEGATIVE NEGATIVE   Leukocytes, UA NEGATIVE NEGATIVE  Influenza panel by PCR (type A & B, H1N1)     Status: Abnormal   Collection Time: 07/30/15  1:29 PM  Result Value Ref Range   Influenza A By PCR POSITIVE (A) NEGATIVE   Influenza B By PCR NEGATIVE NEGATIVE   H1N1 flu by pcr NOT DETECTED NOT DETECTED

## 2015-07-30 NOTE — MAU Note (Signed)
Started yesterday morning, was coughing, is productive.  Throughout the day, started getting sore throat.  Chest feels tight. Her mom just got out of the hosp for a resp infection.. Does not know if she has a fever.  Has felt kind of dizzy at times.

## 2015-07-30 NOTE — MAU Note (Signed)
Urine in lab 

## 2015-07-30 NOTE — Discharge Instructions (Signed)
Take the medication with food as directed. Be sure you are drinking plenty of fluids and follow up with your doctor. Return for worsening symptoms.

## 2015-07-31 NOTE — MAU Note (Signed)
Chart pulled up to give pt a work note

## 2015-11-13 ENCOUNTER — Inpatient Hospital Stay (HOSPITAL_COMMUNITY)
Admission: AD | Admit: 2015-11-13 | Discharge: 2015-11-13 | Disposition: A | Discharge status: Home / Self Care | Payer: Medicaid Other | Source: Ambulatory Visit | Attending: Obstetrics and Gynecology | Admitting: Obstetrics and Gynecology

## 2015-11-13 ENCOUNTER — Inpatient Hospital Stay (HOSPITAL_COMMUNITY): Payer: Medicaid Other

## 2015-11-13 ENCOUNTER — Encounter (HOSPITAL_COMMUNITY): Payer: Self-pay

## 2015-11-13 DIAGNOSIS — F329 Major depressive disorder, single episode, unspecified: Secondary | ICD-10-CM | POA: Insufficient documentation

## 2015-11-13 DIAGNOSIS — Z87891 Personal history of nicotine dependence: Secondary | ICD-10-CM | POA: Diagnosis not present

## 2015-11-13 DIAGNOSIS — O4703 False labor before 37 completed weeks of gestation, third trimester: Secondary | ICD-10-CM | POA: Diagnosis not present

## 2015-11-13 DIAGNOSIS — Z041 Encounter for examination and observation following transport accident: Secondary | ICD-10-CM | POA: Diagnosis not present

## 2015-11-13 DIAGNOSIS — Z888 Allergy status to other drugs, medicaments and biological substances status: Secondary | ICD-10-CM | POA: Diagnosis not present

## 2015-11-13 DIAGNOSIS — Z3A32 32 weeks gestation of pregnancy: Secondary | ICD-10-CM | POA: Diagnosis not present

## 2015-11-13 LAB — COMPREHENSIVE METABOLIC PANEL
ALT: 8 U/L — AB (ref 14–54)
AST: 15 U/L (ref 15–41)
Albumin: 3.1 g/dL — ABNORMAL LOW (ref 3.5–5.0)
Alkaline Phosphatase: 63 U/L (ref 38–126)
Anion gap: 10 (ref 5–15)
BUN: 10 mg/dL (ref 6–20)
CHLORIDE: 106 mmol/L (ref 101–111)
CO2: 22 mmol/L (ref 22–32)
CREATININE: 0.85 mg/dL (ref 0.44–1.00)
Calcium: 8.8 mg/dL — ABNORMAL LOW (ref 8.9–10.3)
GFR calc non Af Amer: >60 mL/min (ref >60)
Glucose, Bld: 85 mg/dL (ref 65–99)
Potassium: 3.4 mmol/L — ABNORMAL LOW (ref 3.5–5.1)
SODIUM: 138 mmol/L (ref 135–145)
Total Bilirubin: 0.6 mg/dL (ref 0.3–1.2)
Total Protein: 6.9 g/dL (ref 6.5–8.1)

## 2015-11-13 LAB — URINE MICROSCOPIC-ADD ON: RBC / HPF: NONE SEEN RBC/hpf (ref 0–5)

## 2015-11-13 LAB — CBC
HEMATOCRIT: 34.1 % — AB (ref 36.0–46.0)
Hemoglobin: 11.7 g/dL — ABNORMAL LOW (ref 12.0–15.0)
MCH: 29.5 pg (ref 26.0–34.0)
MCHC: 34.3 g/dL (ref 30.0–36.0)
MCV: 86.1 fL (ref 78.0–100.0)
PLATELETS: 273 10*3/uL (ref 150–400)
RBC: 3.96 MIL/uL (ref 3.87–5.11)
RDW: 13.9 % (ref 11.5–15.5)
WBC: 15.7 10*3/uL — ABNORMAL HIGH (ref 4.0–10.5)

## 2015-11-13 LAB — KLEIHAUER-BETKE STAIN
# Vials RhIg: 1
FETAL CELLS %: 0 %
Quantitation Fetal Hemoglobin: 0 mL

## 2015-11-13 LAB — URINALYSIS, ROUTINE W REFLEX MICROSCOPIC
BILIRUBIN URINE: NEGATIVE
GLUCOSE, UA: NEGATIVE mg/dL
HGB URINE DIPSTICK: NEGATIVE
KETONES UR: NEGATIVE mg/dL
Nitrite: NEGATIVE
PH: 5.5 (ref 5.0–8.0)
Protein, ur: NEGATIVE mg/dL
Specific Gravity, Urine: >1.03 — ABNORMAL HIGH (ref 1.005–1.030)

## 2015-11-13 LAB — PROTEIN / CREATININE RATIO, URINE
Creatinine, Urine: 245 mg/dL
Protein Creatinine Ratio: 0.08 mg/mg{Cre} (ref 0.00–0.15)
TOTAL PROTEIN, URINE: 20 mg/dL

## 2015-11-13 MED ORDER — LACTATED RINGERS IV BOLUS (SEPSIS)
1000.0000 mL | Freq: Once | INTRAVENOUS | Status: AC
  Administered 2015-11-13: 1000 mL via INTRAVENOUS

## 2015-11-13 MED ORDER — ACETAMINOPHEN 325 MG PO TABS
650.0000 mg | ORAL_TABLET | Freq: Once | ORAL | Status: AC
  Administered 2015-11-13: 650 mg via ORAL
  Filled 2015-11-13: qty 2

## 2015-11-13 NOTE — Discharge Instructions (Signed)
Placental Abruption  Your placenta is the organ that nourishes your unborn baby (fetus). Your baby gets his or her blood supply and nutrients through your placenta. It is your baby's life support system. It is attached to the inside of your uterus until after your baby is born.   Placental abruption is when the placenta partly or completely separates from the uterus before your baby is born. This is rare, but it can happen any time after 20 weeks of pregnancy. A small separation may not cause problems, but a large separation may be dangerous for you and your baby.  CAUSES   Most of the time the cause of a placental abruption is unknown. Though it is rare, a placental abruption can be caused by:    An abdominal injury.    The baby turning from a buttocks-first position (breech presentation) or a sideways position (transverse) to a headfirst position (cephalic).    Delivering the first of multiple babies (twins, triplets, or more).    Sudden loss of amniotic fluid (premature rupture of the membranes).    An abnormally short umbilical cord.  RISK FACTORS  Some risk factors make a placental abruption more likely, including:   History of placental abruption.   High blood pressure (hypertension).   Smoking.   Alcohol intake.   Blood clotting problems.   Too much amniotic fluid.   Having had multiples (twins or triplets or more).   Seizures and convulsions.   Diabetes mellitus.   Having had more than four children.   Age 23 years or older.   Illegal drug use.   Injury to your abdomen.  SIGNS AND SYMPTOMS   A small placental abruption may not cause symptoms. If you do have symptoms, they may include:   Mild abdominal pain.   Slight vaginal bleeding.  Symptoms of severe placental abruption depend on the size of the separation and the stage of pregnancy. Symptoms may include:    Sudden pain in your uterus.   Abdominal pain.   Vaginal bleeding.   Tender uterus.   Severe abdominal pain with  tenderness.   Continual contractions of your uterus.   Back pain.   Weakness, light-headedness.  DIAGNOSIS   Placental abruption is suspected when a pregnant woman develops sudden pain in her uterus. The health care provider will check whether the uterus is very tender, hard, and enlarging and whether the baby has an abnormal heart rate or rhythm. Ultrasonography (commonly called an ultrasound) will be done. Blood work will also be done to make sure that there are enough healthy red blood cells and that there are no clotting problems or signs of too much blood loss.  TREATMENT   Placental abruption is usually an emergency. It requires treatment right away. Your treatment will depend on:    The amount of bleeding.   Whether you or you baby are in distress.   The stage of your pregnancy.   The maturity of the baby.  Treatment for partial separation of the placenta is bed rest and close observation. You also may need a blood transfusion or to receive fluids through an IV tube. Treatment for complete placental separation is delivery of your baby. You may have a cesarean delivery if your baby is in distress.  HOME CARE INSTRUCTIONS    Only take medicines as directed by your health care provider.   Arrange for help at home before and after you deliver the baby, especially if you had a cesarean delivery or   lost a lot of blood.   Get plenty of rest and sleep.   Do not have sexual intercourse until your health care provider says it is okay.   Do not use tampons or douche unless your health care provider says it is okay.  SEEK MEDICAL CARE IF:   You have light vaginal bleeding or spotting.   You have any type of trauma, such as a fall or jolt during an accident.   You are having trouble avoiding drugs, alcohol, or smoking.  SEEK IMMEDIATE MEDICAL CARE IF:   You have vaginal bleeding.   You have abdominal pain.   You have continuous uterine contractions.   You have a hard, tender uterus.   You do not feel  the baby move, or the baby moves very little.  MAKE SURE YOU:   Understand these instructions.   Will watch your condition.   Will get help right away if you are not doing well or get worse.     This information is not intended to replace advice given to you by your health care provider. Make sure you discuss any questions you have with your health care provider.     Document Released: 06/21/2005 Document Revised: 06/26/2013 Document Reviewed: 04/13/2013  Elsevier Interactive Patient Education 2016 [employer]
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
## 2016-03-16 ENCOUNTER — Encounter (HOSPITAL_COMMUNITY): Payer: Self-pay

## 2017-03-04 DIAGNOSIS — O0990 Supervision of high risk pregnancy, unspecified, unspecified trimester: Secondary | ICD-10-CM | POA: Insufficient documentation

## 2017-03-04 DIAGNOSIS — O3431 Maternal care for cervical incompetence, first trimester: Secondary | ICD-10-CM | POA: Insufficient documentation

## 2017-03-04 DIAGNOSIS — Z6834 Body mass index (BMI) 34.0-34.9, adult: Secondary | ICD-10-CM | POA: Insufficient documentation

## 2017-03-04 DIAGNOSIS — Z8751 Personal history of pre-term labor: Secondary | ICD-10-CM | POA: Insufficient documentation

## 2017-03-04 DIAGNOSIS — F418 Other specified anxiety disorders: Secondary | ICD-10-CM | POA: Insufficient documentation

## 2017-07-24 IMAGING — US US OB TRANSVAGINAL
1 series · 15 of 28 positions shown · non-contrast
Comparison: 05/10/2015 obstetric scan.

CLINICAL DATA: 24-year-old pregnant female presents for follow-up
of possible intrauterine gestation on recent sonogram. No acute
symptoms are reported.

EDC by LMP: 01/05/2016, projecting to an expected gestational age of
7 weeks 0 days.
EXAM:
TRANSVAGINAL OB ULTRASOUND
TECHNIQUE: Transvaginal ultrasound was performed for complete evaluation of the
gestation as well as the maternal uterus, adnexal regions, and
pelvic cul-de-sac.

[Series 1: us ob transvaginal · 35 acquisitions, 15 frames shown]
[im 1/35]
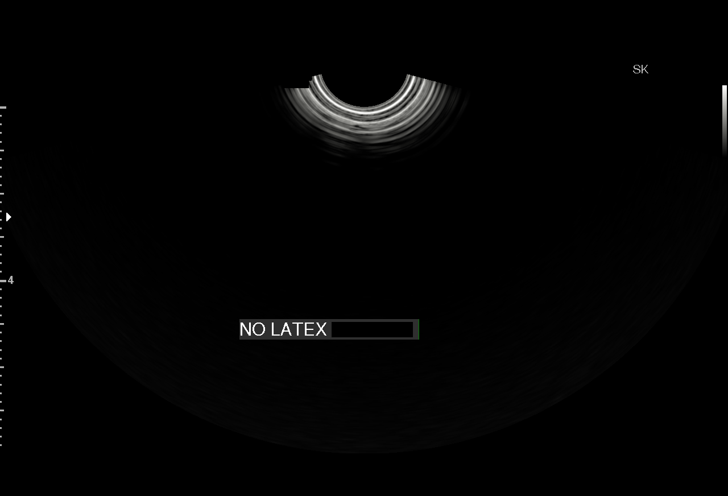
[im 3/35]
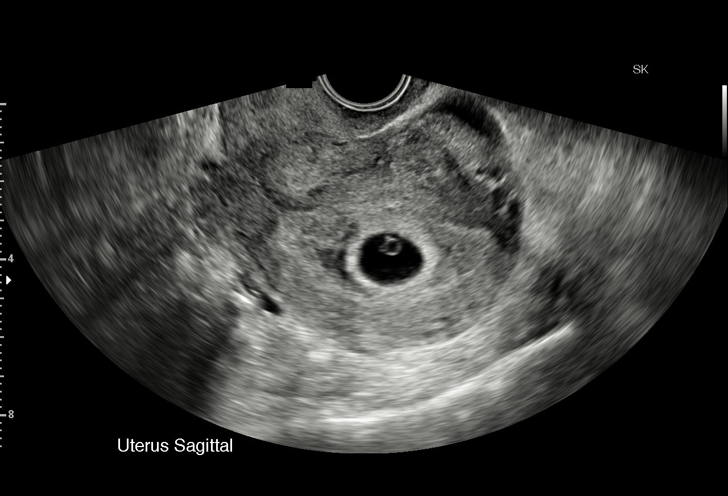
[im 6/35]
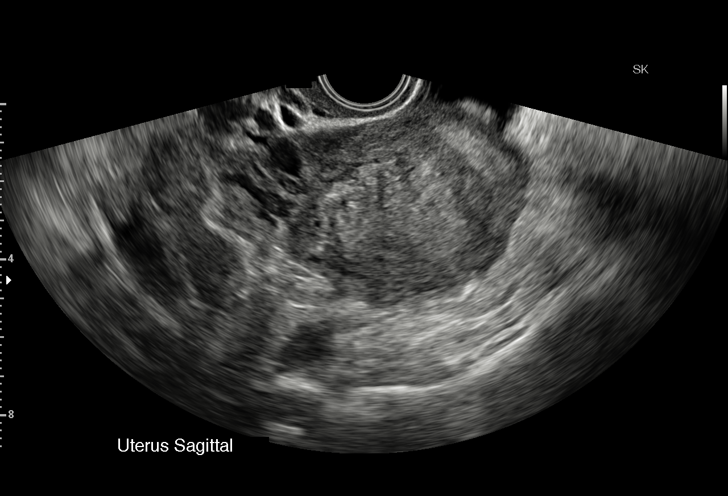
[im 8/35]
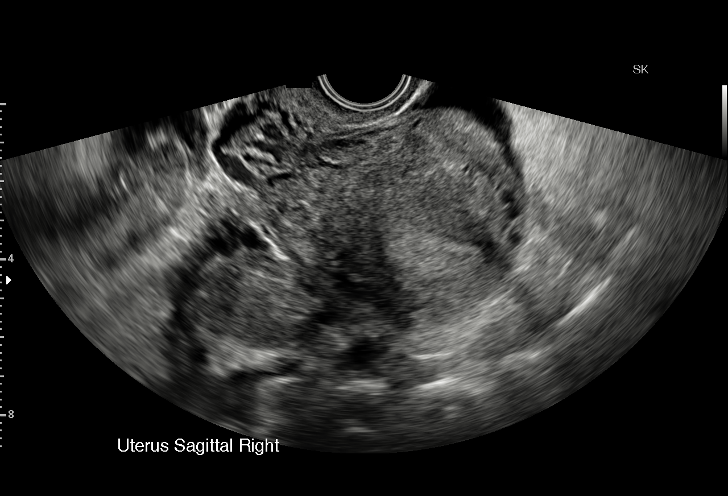
[im 11/35]
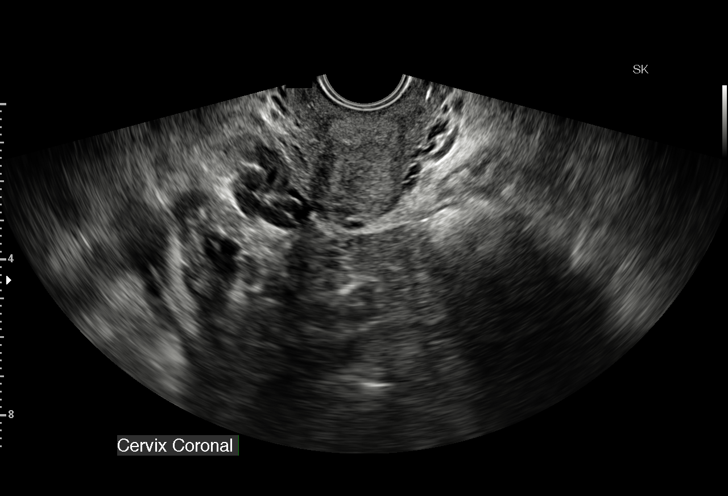
[im 13/35]
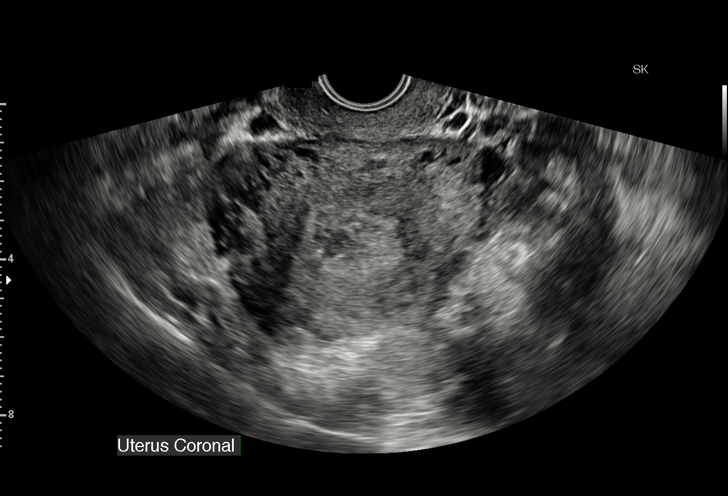
[im 16/35]
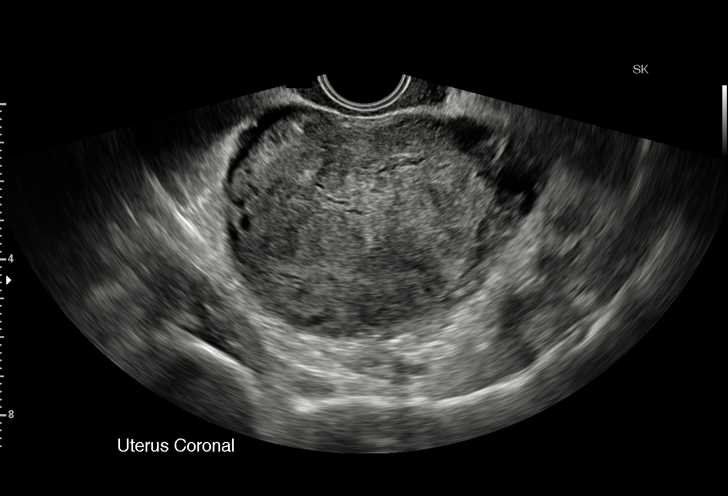
[im 18/35]
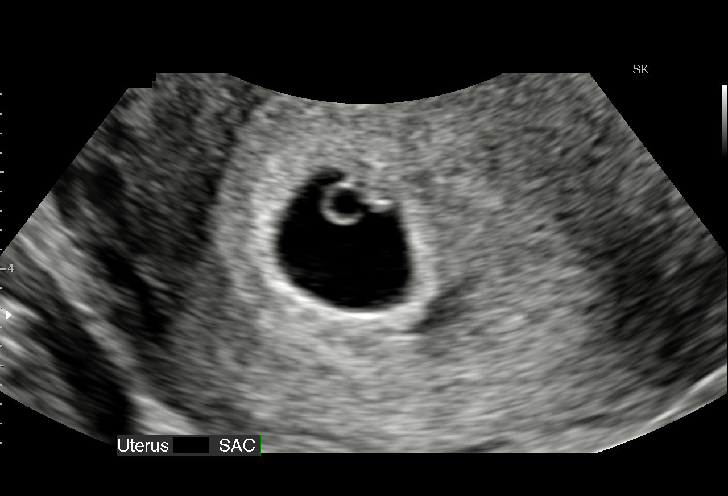
[im 19/35]
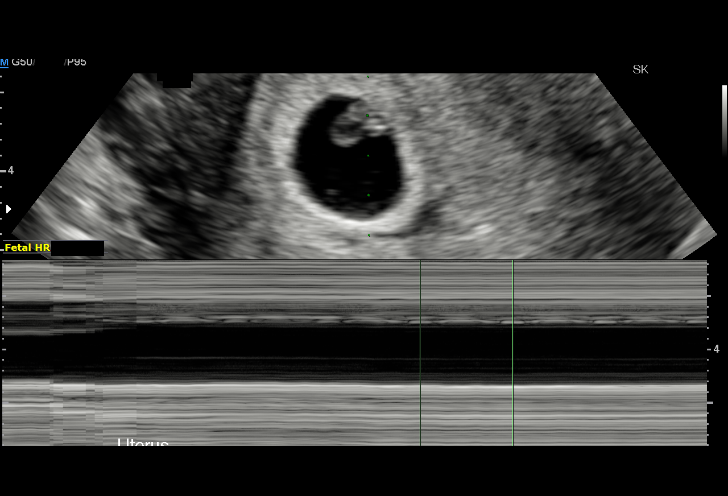
[im 22/35]
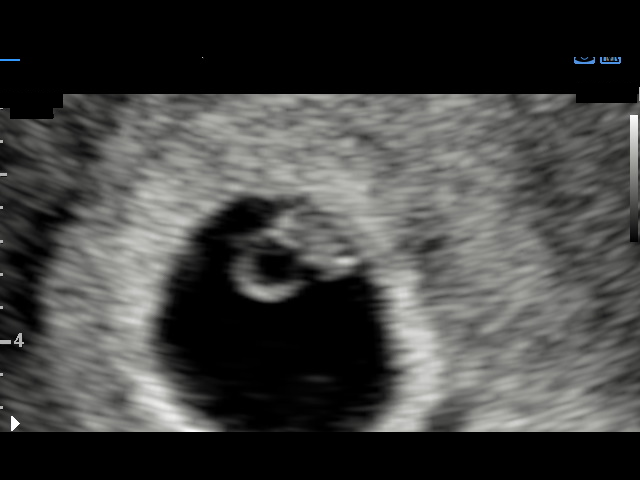
[im 24/35]
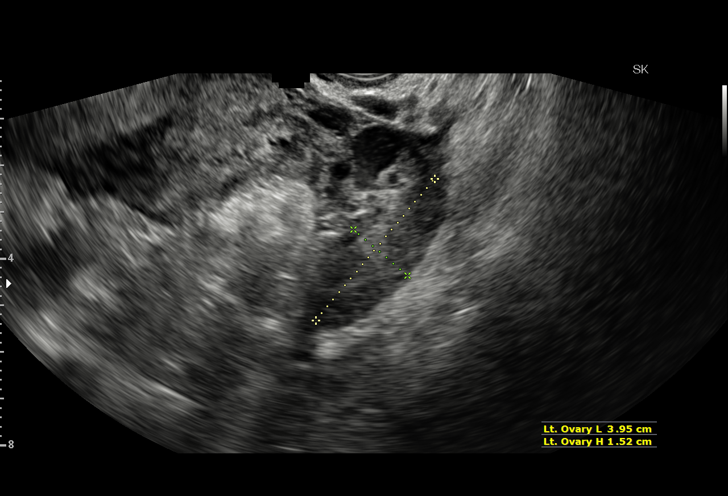
[im 27/35]
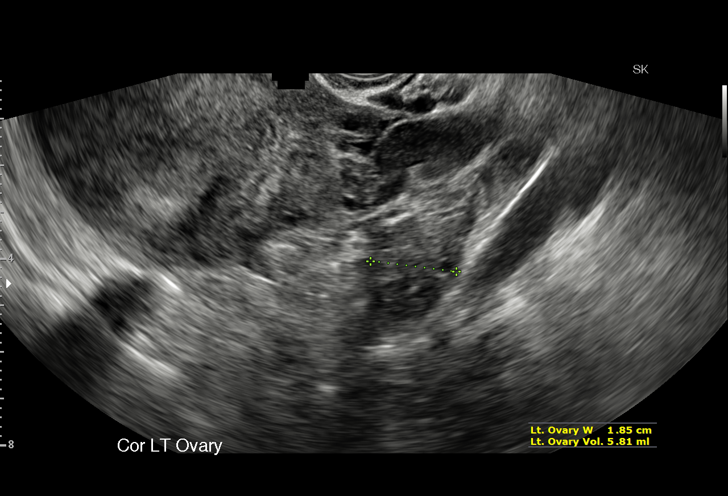
[im 29/35]
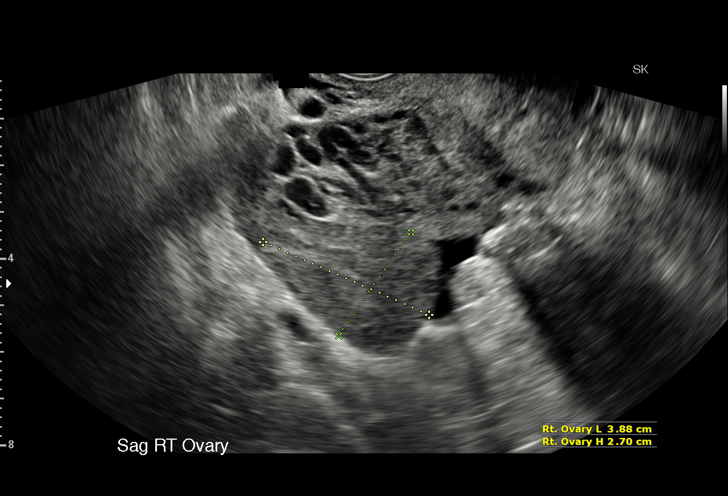
[im 32/35]
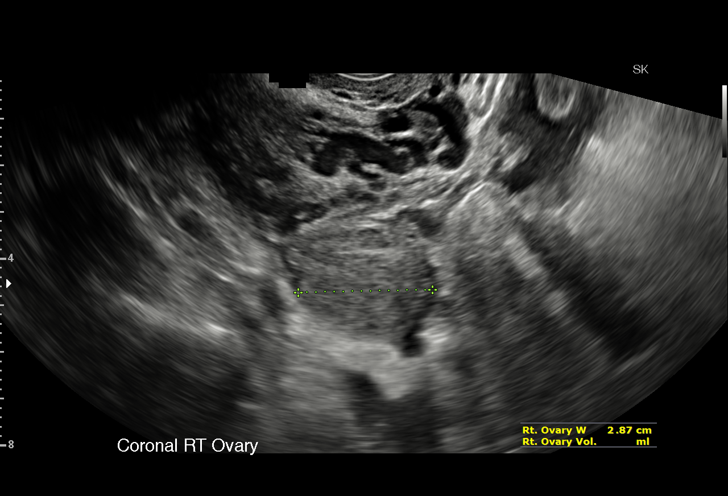
[im 35/35]
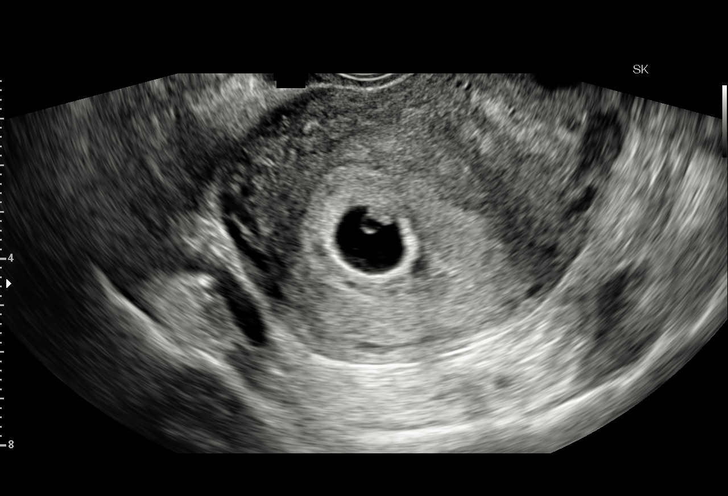

[15 of 28 positions shown; findings below may reference images not displayed]

FINDINGS: Intrauterine gestational sac: There is a single intrauterine
gestational sac, which appears normal in size, shape and position.

Yolk sac:  Present and normal.

Embryo:  Present .

Cardiac Activity: Regular rate and rhythm

Heart Rate: 110 bpm

CRL:   5.2  mm   6 w 1 d                  US EDC: 01/11/2016

Maternal uterus/adnexae: Maternal left ovary measures 4.0 x 1.5 x
1.9 cm. Maternal right ovary measures 3.9 x 2.7 x 2.9 cm. There is a
corpus luteum in the right ovary. No suspicious ovarian or adnexal
mass. No abnormal free fluid in the pelvis. No uterine fibroids. No
perigestational bleed.
IMPRESSION: 1. Single living intrauterine gestation at 6 weeks 1 day by
crown-rump length, with no significant discrepancy with the expected
gestational age of 7 weeks 0 days by provided menstrual dating.
2. No first trimester gestational abnormality. Normal embryonic
cardiac activity.
3. No maternal ovarian or adnexal abnormality.

## 2017-07-26 ENCOUNTER — Other Ambulatory Visit: Payer: Self-pay

## 2017-07-26 ENCOUNTER — Emergency Department (HOSPITAL_COMMUNITY)
Admission: EM | Admit: 2017-07-26 | Discharge: 2017-07-26 | Disposition: A | Discharge status: Home / Self Care | Payer: Self-pay | Attending: Emergency Medicine | Admitting: Emergency Medicine

## 2017-07-26 ENCOUNTER — Encounter (HOSPITAL_COMMUNITY): Payer: Self-pay | Admitting: Emergency Medicine

## 2017-07-26 DIAGNOSIS — J029 Acute pharyngitis, unspecified: Secondary | ICD-10-CM | POA: Insufficient documentation

## 2017-07-26 DIAGNOSIS — Z79899 Other long term (current) drug therapy: Secondary | ICD-10-CM | POA: Insufficient documentation

## 2017-07-26 DIAGNOSIS — F17210 Nicotine dependence, cigarettes, uncomplicated: Secondary | ICD-10-CM | POA: Insufficient documentation

## 2017-07-26 LAB — RAPID STREP SCREEN (MED CTR MEBANE ONLY): Streptococcus, Group A Screen (Direct): NEGATIVE

## 2017-07-26 MED ORDER — AMOXICILLIN 500 MG PO CAPS: 500.0000 mg | ORAL_CAPSULE | Freq: Three times a day (TID) | ORAL | 0 refills | Status: DC

## 2017-07-26 NOTE — ED Provider Notes (Signed)
[facility]ERGENCY DEPARTMENT Provider Note   CSN: [id number] Arrival date & time: 07/26/17  1547     History   Chief Complaint Chief Complaint  Patient presents with  . Sore Throat    HPI Dana Simpson is a 27 y.o. female.  The history is provided by the patient. No language interpreter was used.  Sore Throat  This is a new problem. The current episode started yesterday. The problem occurs constantly. The problem has been gradually worsening. Pertinent negatives include no headaches. Nothing aggravates the symptoms. Nothing relieves the symptoms. She has tried nothing for the symptoms. The treatment provided no relief.   Pt complains of a sore throat.  Pt reports he coworker had strep  Past Medical History:  Diagnosis Date  . Depression   . Migraines   . Spinal headache    after cerclage with second pregnancy    Patient Active Problem List   Diagnosis Date Noted  . Nausea with vomiting 08/23/2013  . Acute pharyngitis 08/23/2013  . Unspecified episodic mood disorder 07/23/2013  . GERD (gastroesophageal reflux disease) 06/11/2013  . Obesity, unspecified 06/11/2013  . Depression 10/02/2012  . Eczema 08/11/2012  . History of drug dependence (HCC) 05/22/2012  . Anxiety 05/22/2012  . Migraine headache 05/22/2012  . History of suicide attempt 05/22/2012  . TOBACCO ABUSE 01/15/2008    Past Surgical History:  Procedure Laterality Date  . ADENOIDECTOMY    . CERCLAGE REMOVAL    . CERVICAL CERCLAGE  07/02/15  . CESAREAN SECTION    . TONSILLECTOMY      OB History    Gravida Para Term Preterm AB Living   3 2 1 1  0 1   SAB TAB Ectopic Multiple Live Births   0 0 0 0 2       Home Medications    Prior to Admission medications   Medication Sig Start Date End Date Taking? Authorizing Provider  acetaminophen (TYLENOL) 325 MG tablet Take 650 mg by mouth every 6 (six) hours as needed for mild pain or headache.    [provider]  Prenatal Vit-Fe  Fumarate-FA (PRENATAL MULTIVITAMIN) TABS tablet Take 1 tablet by mouth daily at 12 noon.    [provider]    Family History Family History  Problem Relation Age of Onset  . Diabetes Father   . Diabetes Paternal Aunt   . Diabetes Paternal Grandmother     Social History Social History   Tobacco Use  . Smoking status: Current Every Day Smoker    Packs/day: 0.50    Types: Cigarettes    Start date: 06/04/2012    Last attempt to quit: 06/07/2015    Years since quitting: 2.1  . Smokeless tobacco: Never Used  Substance Use Topics  . Alcohol use: No  . Drug use: No     Allergies   Lamictal [lamotrigine]   Review of Systems Review of Systems  Neurological: Negative for headaches.  All other systems reviewed and are negative.    Physical Exam Updated Vital Signs BP (!) 132/96 (BP Location: Right Arm)   Pulse 83   Temp 98.2 F (36.8 C) (Oral)   Resp 16   Ht 5\' 7"  (1.702 m)   Wt 104.3 kg (230 lb)   SpO2 100%   BMI 36.02 kg/m   Physical Exam  Constitutional: She appears well-developed and well-nourished. No distress.  HENT:  Head: Normocephalic and atraumatic.  Mouth/Throat: Mucous membranes are normal. Posterior oropharyngeal erythema present.  Eyes:  Conjunctivae are normal.  Neck: Neck supple.  Cardiovascular: Normal rate and regular rhythm.  No murmur heard. Pulmonary/Chest: Effort normal and breath sounds normal. No respiratory distress.  Abdominal: Soft. There is no tenderness.  Musculoskeletal: She exhibits no edema.  Neurological: She is alert.  Skin: Skin is warm and dry.  Psychiatric: She has a normal mood and affect.  Nursing note and vitals reviewed.    ED Treatments / Results  Labs (all labs ordered are listed, but only abnormal results are displayed) Labs Reviewed  RAPID STREP SCREEN (NOT AT [facility])  CULTURE, GROUP A STREP [facility])    EKG  EKG Interpretation None       Radiology No results found.  Procedures Procedures  (including critical care time)  Medications Ordered in ED Medications - No data to display   Initial Impression / Assessment and Plan / ED Course  I have reviewed the triage vital signs and the nursing notes.  Pertinent labs & imaging results that were available during my care of the patient were reviewed by me and considered in my medical decision making (see chart for details).       Final Clinical Impressions(s) / ED Diagnoses   Final diagnoses:  Acute pharyngitis, unspecified etiology    ED Discharge Orders        Ordered    amoxicillin (AMOXIL) 500 MG capsule  3 times daily     07/26/17 1845    An After Visit Summary was printed and given to the patient.    Elson Areas, PA-C 07/26/17 1846    Mancel Bale, MD 07/26/17 [phone]

## 2017-07-26 NOTE — ED Notes (Signed)
Pt alert & oriented x4, stable gait. Patient given discharge instructions, paperwork & prescription(s). Patient  instructed to stop at the registration desk to finish any additional paperwork. Patient verbalized understanding. Pt left department w/ no further questions. 

## 2017-07-26 NOTE — ED Triage Notes (Signed)
Patient reports sore throat and fever. Onset last night.

## 2017-07-26 NOTE — Discharge Instructions (Signed)
Return if any problems.

## 2017-07-29 LAB — CULTURE, GROUP A STREP (THRC)

## 2018-05-22 ENCOUNTER — Encounter (HOSPITAL_COMMUNITY): Payer: Self-pay | Admitting: *Deleted

## 2018-06-15 ENCOUNTER — Ambulatory Visit (HOSPITAL_COMMUNITY): Payer: Medicaid Other

## 2018-07-04 ENCOUNTER — Ambulatory Visit: Payer: Medicaid Other | Admitting: Obstetrics & Gynecology

## 2018-07-11 ENCOUNTER — Ambulatory Visit (HOSPITAL_COMMUNITY)
Admission: RE | Admit: 2018-07-11 | Discharge: 2018-07-11 | Disposition: A | Discharge status: Home / Self Care | Payer: Medicaid Other | Source: Ambulatory Visit | Attending: Obstetrics and Gynecology | Admitting: Obstetrics and Gynecology

## 2018-07-11 ENCOUNTER — Encounter (HOSPITAL_COMMUNITY): Payer: Self-pay

## 2018-07-11 ENCOUNTER — Other Ambulatory Visit (HOSPITAL_COMMUNITY): Payer: Self-pay | Admitting: *Deleted

## 2018-07-11 VITALS — BP 112/74 | Wt 220.0 lb

## 2018-07-11 DIAGNOSIS — N644 Mastodynia: Secondary | ICD-10-CM

## 2018-07-11 DIAGNOSIS — Z1239 Encounter for other screening for malignant neoplasm of breast: Secondary | ICD-10-CM

## 2018-07-11 DIAGNOSIS — N6452 Nipple discharge: Secondary | ICD-10-CM

## 2018-07-11 NOTE — Patient Instructions (Signed)
Explained breast self awareness with Dana Simpson. Patient did not need a Pap smear today due to last Pap smear was 04/20/2018. Explained the colposcopy the recommended follow-up for her abnormal Pap smear. Referred patient to the Center for [facility] at [facility] for a colposcopy. Appointment scheduled for Tuesday, July 25, 2018 at 1055. Referred patient to the [facility] of [facility] for a bilateral breast ultrasound. Appointment scheduled for Wednesday, July 19, 2018 at 1210. Patient aware of appointments and will be there. Discussed smoking cessation with patient. Referred to the [facility] Quitline and gave resources to the free smoking cessation classes at [facility]. Dana Simpson verbalized understanding.  Murlene Revell, Kathaleen Maser, RN 1:13 PM

## 2018-07-11 NOTE — Progress Notes (Signed)
Patient referred to Dana Simpson by [facility] due to having an abnormal Pap smear 04/20/2018 that a colposcopy is recommended for follow-up.   Patient complained of spontaneous bilateral milky breast discharge x 2 months.  Pap Smear: Pap smear not completed today. Last Pap smear was 04/20/2018 at [facility] in [place] and [facility]. Referred patient to the [facility] at [facility] for a colposcopy. Appointment scheduled for Tuesday, July 25, 2018 at 1055. Patient has a history of one other abnormal Pap smear 06/11/2013 that was ASCUS with negative HPV that a repeat Pap smear was completed for follow-up. Last two Pap smear results are in Epic.  Physical exam: Breasts Breasts symmetrical. No skin abnormalities bilateral breasts. Bilateral nipple retraction that per patient is normal for her. No nipple discharge left breast. Expressed a scant amount of milky appearing discharge from the right breast on exam. Unable to express enough discharge to send a sample to Cytology for evaluation. No lymphadenopathy. No lumps palpated bilateral breasts. Complaints of left upper breast pain on exam. Referred patient to the [facility] for a bilateral breast ultrasound. Appointment scheduled for Wednesday, July 19, 2018 at 1210.        Pelvic/Bimanual No Pap smear completed today since last Pap smear was 04/20/2018. Pap smear not indicated per BCCCP guidelines.   Smoking History: Patient is a current smoker. Discussed smoking cessation with patient. Referred to the [facility] Quitline and gave resources to the free smoking cessation classes at [facility].  Patient Navigation: Patient education provided. Access to services provided for patient through BCCCP program.   Breast and Cervical Cancer Risk Assessment: Patient has no family history of breast cancer, known genetic mutations, or radiation treatment to the chest before age 54. Patient has no history of cervical  dysplasia, immunocompromised, or DES exposure in-utero. Breast Cancer risk assessment completed. No breast cancer risk calculated due to patient is less than 26 years old.

## 2018-07-14 ENCOUNTER — Encounter (HOSPITAL_COMMUNITY): Payer: Self-pay | Admitting: *Deleted

## 2018-07-17 ENCOUNTER — Ambulatory Visit: Payer: Medicaid Other | Admitting: Obstetrics and Gynecology

## 2018-07-19 ENCOUNTER — Other Ambulatory Visit: Payer: Medicaid Other

## 2018-07-19 ENCOUNTER — Inpatient Hospital Stay: Admission: RE | Admit: 2018-07-19 | Payer: Medicaid Other | Source: Ambulatory Visit

## 2018-07-25 ENCOUNTER — Ambulatory Visit: Payer: Medicaid Other | Admitting: Obstetrics & Gynecology

## 2020-05-12 ENCOUNTER — Emergency Department (HOSPITAL_COMMUNITY)
Admission: EM | Admit: 2020-05-12 | Discharge: 2020-05-12 | Disposition: A | Discharge status: Home / Self Care | Payer: Medicaid Other | Attending: Emergency Medicine | Admitting: Emergency Medicine

## 2020-05-12 ENCOUNTER — Other Ambulatory Visit: Payer: Self-pay

## 2020-05-12 ENCOUNTER — Emergency Department (HOSPITAL_COMMUNITY): Payer: Medicaid Other

## 2020-05-12 ENCOUNTER — Encounter (HOSPITAL_COMMUNITY): Payer: Self-pay

## 2020-05-12 DIAGNOSIS — Z20822 Contact with and (suspected) exposure to covid-19: Secondary | ICD-10-CM | POA: Diagnosis not present

## 2020-05-12 DIAGNOSIS — J069 Acute upper respiratory infection, unspecified: Secondary | ICD-10-CM | POA: Diagnosis not present

## 2020-05-12 DIAGNOSIS — R059 Cough, unspecified: Secondary | ICD-10-CM

## 2020-05-12 DIAGNOSIS — F17210 Nicotine dependence, cigarettes, uncomplicated: Secondary | ICD-10-CM | POA: Insufficient documentation

## 2020-05-12 LAB — RESP PANEL BY RT PCR (RSV, FLU A&B, COVID)
Influenza A by PCR: NEGATIVE
Influenza B by PCR: NEGATIVE
Respiratory Syncytial Virus by PCR: NEGATIVE
SARS Coronavirus 2 by RT PCR: NEGATIVE

## 2020-05-12 MED ORDER — BENZONATATE 100 MG PO CAPS: 100.0000 mg | ORAL_CAPSULE | Freq: Four times a day (QID) | ORAL | 0 refills | Status: DC | PRN

## 2020-05-12 NOTE — ED Provider Notes (Signed)
[facility] EMERGENCY DEPARTMENT Provider Note   CSN: [id number] Arrival date & time: 05/12/20  0740     History Chief Complaint  Patient presents with  . Cough    Dana Simpson is a 29 y.o. female.  The history is provided by the patient. No language interpreter was used.  Cough Cough characteristics:  Non-productive Sputum characteristics:  Nondescript Severity:  Moderate Onset quality:  Gradual Timing:  Constant Progression:  Worsening Chronicity:  New Smoker: no   Context: not sick contacts   Relieved by:  Nothing Worsened by:  Nothing Ineffective treatments:  None tried Associated symptoms: no sore throat        Past Medical History:  Diagnosis Date  . Depression   . Migraines   . Spinal headache    after cerclage with second pregnancy    Patient Active Problem List   Diagnosis Date Noted  . Nausea with vomiting 08/23/2013  . Acute pharyngitis 08/23/2013  . Unspecified episodic mood disorder 07/23/2013  . GERD (gastroesophageal reflux disease) 06/11/2013  . Obesity, unspecified 06/11/2013  . Depression 10/02/2012  . Eczema 08/11/2012  . History of drug dependence (HCC) 05/22/2012  . Anxiety 05/22/2012  . Migraine headache 05/22/2012  . History of suicide attempt 05/22/2012  . TOBACCO ABUSE 01/15/2008    Past Surgical History:  Procedure Laterality Date  . ADENOIDECTOMY    . CERCLAGE REMOVAL    . CERVICAL CERCLAGE  07/02/15  . CESAREAN SECTION     2 previous   . TONSILLECTOMY       OB History    Gravida  3   Para  3   Term  2   Preterm  1   AB  0   Living  2     SAB  0   TAB  0   Ectopic  0   Multiple  0   Live Births  2           Family History  Problem Relation Age of Onset  . Diabetes Father   . Diabetes Paternal Aunt   . Diabetes Paternal Grandmother     Social History   Tobacco Use  . Smoking status: Current Every Day Smoker    Packs/day: 0.50    Types: Cigarettes    Start date: 06/04/2012  .  Smokeless tobacco: Never Used  Vaping Use  . Vaping Use: Never used  Substance Use Topics  . Alcohol use: Not Currently  . Drug use: No    Home Medications Prior to Admission medications   Medication Sig Start Date End Date Taking? Authorizing Provider  acetaminophen (TYLENOL) 325 MG tablet Take 650 mg by mouth every 6 (six) hours as needed for mild pain or headache.    [provider]  amoxicillin (AMOXIL) 500 MG capsule Take 1 capsule (500 mg total) by mouth 3 (three) times daily. Patient not taking: Reported on 07/11/2018 07/26/17   Elson Areas, PA-C  benzonatate (TESSALON PERLES) 100 MG capsule Take 1 capsule (100 mg total) by mouth every 6 (six) hours as needed for cough. 05/12/20 05/12/21  Elson Areas, PA-C  Prenatal Vit-Fe Fumarate-FA (PRENATAL MULTIVITAMIN) TABS tablet Take 1 tablet by mouth daily at 12 noon.    [provider]    Allergies    Lamictal [lamotrigine]  Review of Systems   Review of Systems  HENT: Negative for sore throat.   Respiratory: Positive for cough.   All other systems reviewed and are negative.  Physical Exam Updated Vital Signs BP (!) 122/56   Pulse 82   Temp 98 F (36.7 C) (Oral)   Resp 20   Ht 5\' 8"  (1.727 m)   Wt 88.4 kg   LMP 04/21/2020   SpO2 99%   BMI 29.63 kg/m   Physical Exam Vitals and nursing note reviewed.  Constitutional:      Appearance: She is well-developed.  HENT:     Head: Normocephalic.     Right Ear: Tympanic membrane normal.     Left Ear: Tympanic membrane normal.     Nose: Nose normal.     Mouth/Throat:     Mouth: Mucous membranes are moist.  Cardiovascular:     Rate and Rhythm: Normal rate and regular rhythm.  Pulmonary:     Effort: Pulmonary effort is normal.  Abdominal:     General: There is no distension.  Musculoskeletal:        General: Normal range of motion.     Cervical back: Normal range of motion.  Neurological:     General: No focal deficit present.     Mental Status:  She is alert and oriented to person, place, and time.  Psychiatric:        Mood and Affect: Mood normal.     ED Results / Procedures / Treatments   Labs (all labs ordered are listed, but only abnormal results are displayed) Labs Reviewed  RESP PANEL BY RT PCR (RSV, FLU A&B, COVID)    EKG None  Radiology DG Chest Port 1 View  Result Date: 05/12/2020 CLINICAL DATA:  29 year old female presenting with cough and flu-like symptoms. EXAM: PORTABLE CHEST 1 VIEW COMPARISON:  None. FINDINGS: The heart size and mediastinal contours are within normal limits. Both lungs are clear. The visualized skeletal structures are unremarkable. IMPRESSION: No active disease. Electronically Signed   By: 34 MD   On: 05/12/2020 08:37    Procedures Procedures (including critical care time)  Medications Ordered in ED Medications - No data to display  ED Course  I have reviewed the triage vital signs and the nursing notes.  Pertinent labs & imaging results that were available during my care of the patient were reviewed by me and considered in my medical decision making (see chart for details).    MDM Rules/Calculators/A&P                          MDM:  Pt counseled on viral illness.  Pt given rx for tessalon. Final Clinical Impression(s) / ED Diagnoses Final diagnoses:  Viral URI with cough    Rx / DC Orders ED Discharge Orders         Ordered    benzonatate (TESSALON PERLES) 100 MG capsule  Every 6 hours PRN        05/12/20 1238        An After Visit Summary was printed and given to the patient.    13/08/21, Elson Areas 05/12/20 1719    13/08/21, MD 05/13/20 [phone]

## 2020-05-12 NOTE — Discharge Instructions (Signed)
Return if any problems.

## 2020-05-12 NOTE — ED Triage Notes (Signed)
Patient c/o cough, congestion, runny nose, and body aches x3 days. Patient unsure of fever. Patient states taking over the counter "flu medication" with no relief. Per patient productive cough with thick yellow sputum.

## 2020-07-22 ENCOUNTER — Encounter (HOSPITAL_COMMUNITY): Payer: Self-pay | Admitting: *Deleted

## 2020-07-22 ENCOUNTER — Emergency Department (HOSPITAL_COMMUNITY)
Admission: EM | Admit: 2020-07-22 | Discharge: 2020-07-22 | Disposition: A | Discharge status: Home / Self Care | Payer: Medicaid Other | Attending: Emergency Medicine | Admitting: Emergency Medicine

## 2020-07-22 ENCOUNTER — Other Ambulatory Visit: Payer: Self-pay

## 2020-07-22 DIAGNOSIS — M791 Myalgia, unspecified site: Secondary | ICD-10-CM | POA: Diagnosis not present

## 2020-07-22 DIAGNOSIS — R109 Unspecified abdominal pain: Secondary | ICD-10-CM | POA: Diagnosis not present

## 2020-07-22 DIAGNOSIS — R509 Fever, unspecified: Secondary | ICD-10-CM | POA: Insufficient documentation

## 2020-07-22 DIAGNOSIS — F17210 Nicotine dependence, cigarettes, uncomplicated: Secondary | ICD-10-CM | POA: Diagnosis not present

## 2020-07-22 DIAGNOSIS — R112 Nausea with vomiting, unspecified: Secondary | ICD-10-CM | POA: Diagnosis not present

## 2020-07-22 DIAGNOSIS — R059 Cough, unspecified: Secondary | ICD-10-CM | POA: Diagnosis not present

## 2020-07-22 DIAGNOSIS — Z20822 Contact with and (suspected) exposure to covid-19: Secondary | ICD-10-CM | POA: Insufficient documentation

## 2020-07-22 MED ORDER — ONDANSETRON 4 MG PO TBDP
4.0000 mg | ORAL_TABLET | Freq: Once | ORAL | Status: AC
  Administered 2020-07-22: 4 mg via ORAL
  Filled 2020-07-22: qty 1

## 2020-07-22 MED ORDER — ONDANSETRON 4 MG PO TBDP: ORAL_TABLET | ORAL | 0 refills | Status: DC

## 2020-07-22 NOTE — ED Triage Notes (Signed)
Pt with body aches, fever( high as 100.3), nausea and decrease in appetite.

## 2020-07-22 NOTE — ED Triage Notes (Signed)
Pt took tylenol and motrin at 0500 this morning.

## 2020-07-22 NOTE — Discharge Instructions (Signed)
You have a COVID test pending I suspect this is the cause of your symptoms.  Please continue to quarantine at home and monitor your symptoms closely.  Antibiotics are not helpful in treating viral infection, the virus should run its course in about 10-14 days. Please make sure you are drinking plenty of fluids. You can treat your symptoms supportively with tylenol and ibuprofen for fevers and pains, and over the counter cough syrups and throat lozenges to help with cough.  Zofran for nausea and vomiting.  If your symptoms are not improving please follow up with you Primary doctor.   I recommend that you purchase a home pulse ox to help better monitor your oxygen at home, if you start to have increased work of breathing or shortness of breath or your oxygen drops below 90% please immediately return to the hospital for reevaluation.  If you develop persistent fevers, shortness of breath or difficulty breathing, chest pain, severe headache and neck pain, persistent nausea and vomiting or other new or concerning symptoms return to the Emergency department.

## 2020-07-22 NOTE — ED Provider Notes (Signed)
[facility] EMERGENCY DEPARTMENT Provider Note   CSN: [id number] Arrival date & time: 07/22/20  1203     History Chief Complaint  Patient presents with  . Generalized Body Aches    Dana Simpson is a 30 y.o. female.  Dana Simpson is a 30 y.o. female with a history of depressions and migraine, who presents to the ED for evaluation of body aches, fever, cough, nausea and vomiting.  Symptoms have been present for the past 2 days.  She reports symptoms have been constant and not improving.  She reports intermittent vomiting, but has been able to keep some things down.  Cough is occasionally productive of mucus but she denies feeling short of breath, sometimes with cough she gets chest tightness but denies any persistent chest pain.  Mild abdominal pain with vomiting that does not localize to 1 area, denies associated diarrhea.  Fevers up to 100.3 at home, took Tylenol and Motrin prior to arrival.  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  Is not vaccinated for COVID.  No other aggravating or alleviating factors.        Past Medical History:  Diagnosis Date  . Depression   . Migraines   . Spinal headache    after cerclage with second pregnancy    Patient Active Problem List   Diagnosis Date Noted  . Nausea with vomiting 08/23/2013  . Acute pharyngitis 08/23/2013  . Unspecified episodic mood disorder 07/23/2013  . GERD (gastroesophageal reflux disease) 06/11/2013  . Obesity, unspecified 06/11/2013  . Depression 10/02/2012  . Eczema 08/11/2012  . History of drug dependence (HCC) 05/22/2012  . Anxiety 05/22/2012  . Migraine headache 05/22/2012  . History of suicide attempt 05/22/2012  . TOBACCO ABUSE 01/15/2008    Past Surgical History:  Procedure Laterality Date  . ADENOIDECTOMY    . CERCLAGE REMOVAL    . CERVICAL CERCLAGE  07/02/15  . CESAREAN SECTION     2 previous    . TONSILLECTOMY       OB History    Gravida  3   Para  3   Term  2   Preterm  1   AB  0   Living  2     SAB  0   IAB  0   Ectopic  0   Multiple  0   Live Births  2           Family History  Problem Relation Age of Onset  . Diabetes Father   . Diabetes Paternal Aunt   . Diabetes Paternal Grandmother     Social History   Tobacco Use  . Smoking status: Current Every Day Smoker    Packs/day: 0.50    Types: Cigarettes    Start date: 06/04/2012  . Smokeless tobacco: Never Used  Vaping Use  . Vaping Use: Never used  Substance Use Topics  . Alcohol use: Not Currently  . Drug use: No    Home Medications Prior to Admission medications   Medication Sig Start Date End Date Taking? Authorizing Provider  ondansetron (ZOFRAN ODT) 4 MG disintegrating tablet 4mg  ODT q4 hours prn nausea/vomit 07/22/20  Yes Javelle Donigan, 07/24/20, PA-C  acetaminophen (TYLENOL) 325 MG tablet Take 650 mg by mouth every 6 (six) hours as needed for mild pain or headache.    [provider]  amoxicillin (AMOXIL) 500 MG capsule  Take 1 capsule (500 mg total) by mouth 3 (three) times daily. Patient not taking: Reported on 07/11/2018 07/26/17   Elson Areas, PA-C  benzonatate (TESSALON PERLES) 100 MG capsule Take 1 capsule (100 mg total) by mouth every 6 (six) hours as needed for cough. 05/12/20 05/12/21  Elson Areas, PA-C  Prenatal Vit-Fe Fumarate-FA (PRENATAL MULTIVITAMIN) TABS tablet Take 1 tablet by mouth daily at 12 noon.    [provider]    Allergies    Lamictal [lamotrigine]  Review of Systems   Review of Systems  Constitutional: Positive for chills, fatigue and fever.  HENT: Positive for congestion and rhinorrhea. Negative for sore throat.   Respiratory: Positive for cough and chest tightness. Negative for shortness of breath.   Cardiovascular: Negative for chest pain.  Gastrointestinal: Positive for diarrhea, nausea and vomiting. Negative for abdominal pain.   Genitourinary: Negative for dysuria and frequency.  Musculoskeletal: Positive for myalgias. Negative for arthralgias.  Skin: Negative for color change and rash.  Neurological: Negative for dizziness, syncope and light-headedness.  All other systems reviewed and are negative.   Physical Exam Updated Vital Signs BP (!) 114/95   Pulse 80   Temp 98.3 F (36.8 C) (Oral)   Resp 20   Ht 5\' 8"  (1.727 m)   Wt 88.1 kg   SpO2 100%   BMI 29.53 kg/m   Physical Exam Vitals and nursing note reviewed.  Constitutional:      General: She is not in acute distress.    Appearance: Normal appearance. She is well-developed and well-nourished. She is not ill-appearing or diaphoretic.     Comments: Well-appearing and in no distress   HENT:     Head: Normocephalic and atraumatic.     Nose: Rhinorrhea present.     Mouth/Throat:     Mouth: Oropharynx is clear and moist. Mucous membranes are moist.     Pharynx: Oropharynx is clear.  Eyes:     General:        Right eye: No discharge.        Left eye: No discharge.     Extraocular Movements: EOM normal.  Neck:     Comments: No rigidity Cardiovascular:     Rate and Rhythm: Normal rate and regular rhythm.     Pulses: Intact distal pulses.     Heart sounds: Normal heart sounds. No murmur heard. No friction rub. No gallop.   Pulmonary:     Effort: Pulmonary effort is normal. No respiratory distress.     Breath sounds: Normal breath sounds. No wheezing or rales.     Comments: Respirations equal and unlabored, patient able to speak in full sentences, lungs clear to auscultation bilaterally  Abdominal:     General: Bowel sounds are normal. There is no distension.     Palpations: Abdomen is soft. There is no mass.     Tenderness: There is no abdominal tenderness. There is no guarding.     Comments: Abdomen soft, nondistended, nontender to palpation in all quadrants without guarding or peritoneal signs  Musculoskeletal:        General: No deformity  or edema.     Cervical back: Neck supple.     Right lower leg: No edema.     Left lower leg: No edema.  Lymphadenopathy:     Cervical: No cervical adenopathy.  Skin:    General: Skin is warm and dry.     Capillary Refill: Capillary refill takes less than 2 seconds.  Neurological:  Mental Status: She is alert and oriented to person, place, and time.     Coordination: Coordination normal.     Comments: Speech is clear, able to follow commands Moves extremities without ataxia, coordination intact  Psychiatric:        Mood and Affect: Mood normal.        Behavior: Behavior normal.     ED Results / Procedures / Treatments   Labs (all labs ordered are listed, but only abnormal results are displayed) Labs Reviewed  SARS CORONAVIRUS 2 (TAT 6-24 HRS)    EKG None  Radiology No results found.  Procedures Procedures (including critical care time)  Medications Ordered in ED Medications  ondansetron (ZOFRAN-ODT) disintegrating tablet 4 mg (4 mg Oral Given 07/22/20 1558)    ED Course  I have reviewed the triage vital signs and the nursing notes.  Pertinent labs & imaging results that were available during my care of the patient were reviewed by me and considered in my medical decision making (see chart for details).    MDM Rules/Calculators/A&P                          30 y.o. female presents with 2 days of Chills, fever, cough, bodyaches, and emesis. Concerned for possible COVID. *Unknown sick contacts. Patient is unvaccinated. Fortunately patient is overall well-appearing, Vitals WNL. Patient with no hypoxia or increased work of breathing at rest or with activity.  Tolerating p.o. here in the ED, will treat with Zofran for nausea and vomiting, patient appears well-hydrated.  Given reassuring O2 sats do not feel that chest x-ray is indicated at this time.  Patient has a COVID test pending which should return in 6-24 hours.  Patient with suspected COVID infection today but  overall symptoms appear mild and evaluation has been very reassuring today. No criteria for admission at this time. Discussed appropriate quarantine at home as well as continued symptomatic treatment. Encourage patient to purchase pulse ox for monitoring of O2 sats at home and discussed strict return precaution. Provided information for post-COVID care clinic as well. Strict return precautions discussed. Patient expresses understanding and agreement. Discharged home in good condition.  Dana Simpson was evaluated in Emergency Department on 07/22/2020 for the symptoms described in the history of present illness. She was evaluated in the context of the global COVID-19 pandemic, which necessitated consideration that the patient might be at risk for infection with the SARS-CoV-2 virus that causes COVID-19. Institutional protocols and algorithms that pertain to the evaluation of patients at risk for COVID-19 are in a state of rapid change based on information released by regulatory bodies including the CDC and federal and state organizations. These policies and algorithms were followed during the patient's care in the ED.   Final Clinical Impression(s) / ED Diagnoses Final diagnoses:  Suspected COVID-19 virus infection    Rx / DC Orders ED Discharge Orders         Ordered    ondansetron (ZOFRAN ODT) 4 MG disintegrating tablet        07/22/20 1528           Dartha Lodge, [place] 07/22/20 1637    Benjiman Core, MD 07/23/20 [phone]

## 2020-07-23 LAB — SARS CORONAVIRUS 2 (TAT 6-24 HRS): SARS Coronavirus 2: NEGATIVE

## 2020-08-13 ENCOUNTER — Other Ambulatory Visit: Payer: Self-pay

## 2020-08-13 ENCOUNTER — Ambulatory Visit
Admission: EM | Admit: 2020-08-13 | Discharge: 2020-08-13 | Disposition: A | Discharge status: Home / Self Care | Payer: Medicaid Other | Attending: Family Medicine | Admitting: Family Medicine

## 2020-08-13 DIAGNOSIS — H66003 Acute suppurative otitis media without spontaneous rupture of ear drum, bilateral: Secondary | ICD-10-CM | POA: Diagnosis not present

## 2020-08-13 DIAGNOSIS — R519 Headache, unspecified: Secondary | ICD-10-CM | POA: Diagnosis not present

## 2020-08-13 DIAGNOSIS — J069 Acute upper respiratory infection, unspecified: Secondary | ICD-10-CM | POA: Diagnosis not present

## 2020-08-13 MED ORDER — PROMETHAZINE-DM 6.25-15 MG/5ML PO SYRP: 5.0000 mL | ORAL_SOLUTION | Freq: Four times a day (QID) | ORAL | 0 refills | Status: DC | PRN

## 2020-08-13 MED ORDER — ACETAMINOPHEN 325 MG PO TABS
650.0000 mg | ORAL_TABLET | Freq: Once | ORAL | Status: AC
  Administered 2020-08-13: 650 mg via ORAL

## 2020-08-13 MED ORDER — AMOXICILLIN-POT CLAVULANATE 875-125 MG PO TABS: 1.0000 | ORAL_TABLET | Freq: Two times a day (BID) | ORAL | 0 refills | Status: AC

## 2020-08-13 NOTE — ED Provider Notes (Signed)
[facility] CARE CENTER   [id number] 08/13/20 Arrival Time: 0934   CC: COVID symptoms  SUBJECTIVE: History from: patient.  KAYLEY ZEIDERS is a 30 y.o. female who presents with body aches, fever and headache since yesterday. Also reports nasal congestion and bilateral otalgia x 7 days. Denies sick exposure to COVID, flu or strep. Denies recent travel. Has negative history of Covid. Has not completed Covid vaccines. Has not taken OTC medications for this. There are no aggravating or alleviating factors. Denies previous symptoms in the past. Denies sinus pain, rhinorrhea, sore throat, SOB, wheezing, chest pain, nausea, changes in bowel or bladder habits.    ROS: As per HPI.  All other pertinent ROS negative.     Past Medical History:  Diagnosis Date  . Depression   . Migraines   . Spinal headache    after cerclage with second pregnancy   Past Surgical History:  Procedure Laterality Date  . ADENOIDECTOMY    . CERCLAGE REMOVAL    . CERVICAL CERCLAGE  07/02/15  . CESAREAN SECTION     2 previous   . TONSILLECTOMY     Allergies  Allergen Reactions  . Lamictal [Lamotrigine] Rash    Trudie Buckler syndrome    No current facility-administered medications on file prior to encounter.   Current Outpatient Medications on File Prior to Encounter  Medication Sig Dispense Refill  . acetaminophen (TYLENOL) 325 MG tablet Take 650 mg by mouth every 6 (six) hours as needed for mild pain or headache.    . benzonatate (TESSALON PERLES) 100 MG capsule Take 1 capsule (100 mg total) by mouth every 6 (six) hours as needed for cough. 30 capsule 0  . ondansetron (ZOFRAN ODT) 4 MG disintegrating tablet 4mg  ODT q4 hours prn nausea/vomit 10 tablet 0  . Prenatal Vit-Fe Fumarate-FA (PRENATAL MULTIVITAMIN) TABS tablet Take 1 tablet by mouth daily at 12 noon.     Social History   Socioeconomic History  . Marital status: Legally Separated    Spouse name: Not on file  . Number of children: 2  . Years  of education: Not on file  . Highest education level: GED or equivalent  Occupational History  . Not on file  Tobacco Use  . Smoking status: Current Every Day Smoker    Packs/day: 0.50    Types: Cigarettes    Start date: 06/04/2012  . Smokeless tobacco: Never Used  Vaping Use  . Vaping Use: Never used  Substance and Sexual Activity  . Alcohol use: Not Currently  . Drug use: No  . Sexual activity: Yes    Birth control/protection: Implant  Other Topics Concern  . Not on file  Social History Narrative  . Not on file   Social Determinants of Health   Financial Resource Strain: Not on file  Food Insecurity: Not on file  Transportation Needs: Not on file  Physical Activity: Not on file  Stress: Not on file  Social Connections: Not on file  Intimate Partner Violence: Not on file   Family History  Problem Relation Age of Onset  . Diabetes Father   . Diabetes Paternal Aunt   . Diabetes Paternal Grandmother     OBJECTIVE:  Vitals:   08/13/20 0940  BP: 127/85  Pulse: (!) 106  Resp: 16  Temp: 99.3 F (37.4 C)  TempSrc: Oral  SpO2: 97%     General appearance: alert; appears fatigued, but nontoxic; speaking in full sentences and tolerating own secretions HEENT: NCAT; Ears: EACs clear, bilateral  TMs erythematous, bulging, with effusion; Eyes: PERRL.  EOM grossly intact. Sinuses: nontender; Nose: nares patent with clear rhinorrhea, Throat: oropharynx erythematous, cobblestoning present, tonsils non erythematous or enlarged, uvula midline  Neck: supple with LAD Lungs: unlabored respirations, symmetrical air entry; cough: absent; no respiratory distress; CTAB  Heart: regular rate and rhythm.  Radial pulses 2+ symmetrical bilaterally Skin: warm and dry Psychological: alert and cooperative; normal mood and affect  LABS:  No results found for this or any previous visit (from the past 24 hour(s)).   ASSESSMENT & PLAN:  1. Non-recurrent acute suppurative otitis media of both  ears without spontaneous rupture of tympanic membranes   2. Viral URI with cough   3. Nonintractable headache, unspecified chronicity pattern, unspecified headache type     Meds ordered this encounter  Medications  . acetaminophen (TYLENOL) tablet 650 mg  . amoxicillin-clavulanate (AUGMENTIN) 875-125 MG tablet    Sig: Take 1 tablet by mouth 2 (two) times daily for 7 days.    Dispense:  14 tablet    Refill:  0    Order Specific Question:   Supervising Provider    Answer:   Merrilee Jansky [id number]  . promethazine-dextromethorphan (PROMETHAZINE-DM) 6.25-15 MG/5ML syrup    Sig: Take 5 mLs by mouth 4 (four) times daily as needed for cough.    Dispense:  118 mL    Refill:  0    Order Specific Question:   Supervising Provider    Answer:   Merrilee Jansky [id number]   Tylenol in office for headache Prescribed Augmentin BID x 7 days for OM Promethazine cough syrup prescribed Sedation precautions given Continue supportive care at home COVID and flu testing ordered.  It will take between 2-3 days for test results. Someone will contact you regarding abnormal results.   Work note provided Patient should remain in quarantine until they have received Covid results.  If negative you may resume normal activities (go back to work/school) while practicing hand hygiene, social distance, and mask wearing.  If positive, patient should remain in quarantine for at least 5 days from symptom onset AND greater than 72 hours after symptoms resolution (absence of fever without the use of fever-reducing medication and improvement in respiratory symptoms), whichever is longer Get plenty of rest and push fluids Use OTC zyrtec for nasal congestion, runny nose, and/or sore throat Use OTC flonase for nasal congestion and runny nose Use medications daily for symptom relief Use OTC medications like ibuprofen or tylenol as needed fever or pain Call or go to the ED if you have any new or worsening symptoms such as  fever, worsening cough, shortness of breath, chest tightness, chest pain, turning blue, changes in mental status.  Reviewed expectations re: course of current medical issues. Questions answered. Outlined signs and symptoms indicating need for more acute intervention. Patient verbalized understanding. After Visit Summary given.         Moshe Cipro, NP 08/13/20 [phone]

## 2020-08-13 NOTE — ED Triage Notes (Signed)
Pt presents with c/o body aches and headache that began yesterday

## 2020-08-13 NOTE — Discharge Instructions (Addendum)
I have sent in Augmentin for you to take twice a day for 7 days.  I have sent in cough syrup for you to take. This medication can make you sleepy. Do not drive while taking this medication.  Your COVID test is pending.  You should self quarantine until the test result is back.    Take Tylenol or ibuprofen as needed for fever or discomfort.  Rest and keep yourself hydrated.    Follow-up with your primary care provider if your symptoms are not improving.

## 2020-08-14 LAB — COVID-19, FLU A+B NAA
Influenza A, NAA: NOT DETECTED
Influenza B, NAA: NOT DETECTED
SARS-CoV-2, NAA: NOT DETECTED

## 2020-08-15 ENCOUNTER — Other Ambulatory Visit: Payer: Self-pay

## 2020-08-15 ENCOUNTER — Ambulatory Visit
Admission: EM | Admit: 2020-08-15 | Discharge: 2020-08-15 | Disposition: A | Discharge status: Home / Self Care | Payer: Medicaid Other | Attending: Emergency Medicine | Admitting: Emergency Medicine

## 2020-08-15 DIAGNOSIS — J029 Acute pharyngitis, unspecified: Secondary | ICD-10-CM | POA: Diagnosis not present

## 2020-08-15 MED ORDER — CETIRIZINE-PSEUDOEPHEDRINE ER 5-120 MG PO TB12: 1.0000 | ORAL_TABLET | Freq: Every day | ORAL | 0 refills | Status: DC

## 2020-08-15 MED ORDER — LIDOCAINE VISCOUS HCL 2 % MT SOLN: 15.0000 mL | OROMUCOSAL | 1 refills | Status: DC | PRN

## 2020-08-15 NOTE — Discharge Instructions (Addendum)
Get plenty of rest and push fluids Lidocaine mouthwash was prescribed Zyrtec D prescribed. Use daily for symptomatic relief Use OTC throat lozenges such as Halls, Vicks or Cepacol to sore throat Viscous lidocaine prescribed.  This is an oral solution you can swish, and gargle as needed for symptomatic relief of sore throat.  Do not exceed 8 doses in a 24 hour period.  Do not use prior to eating, as this will numb your entire mouth.   Drink warm or cool liquids, use throat lozenges, or popsicles to help alleviate symptoms Take OTC ibuprofen or tylenol as needed for pain Follow up with PCP if symptoms persists Return or go to ER if patient has any new or worsening symptoms such as fever, chills, nausea, vomiting, worsening sore throat, cough, abdominal pain, chest pain, changes in bowel or bladder habits, etc..Marland Kitchen

## 2020-08-15 NOTE — ED Provider Notes (Addendum)
[facility]ARE [facility]ENTER   [id number] 08/15/20 Arrival Time: 0840  BT:DVVO THROAT  SUBJECTIVE: History from: patient.  Dana Simpson is a 30 y.o. female who presented to the urgent care with a complaint of sore throat and  headache for the past few days.  Has currently been taking Augmentin and promethazine for cough.  Denies sick exposure to COVID, strep, flu or mono, or precipitating event.  Reported a negative COVID-19 test.  Has tried OTC medication without relief.  Symptoms are made worse with swallowing, but tolerating liquids and own secretions without difficulty.  Reports/ denies previous symptoms in the past.   Denies fatigue, ear pain, sinus pain, rhinorrhea, nasal congestion, cough, SOB, wheezing, chest pain, nausea, rash, changes in bowel or bladder habits.     ROS: As per HPI.  All other pertinent ROS negative.     Past Medical History:  Diagnosis Date  . Depression   . Migraines   . Spinal headache    after cerclage with second pregnancy   Past Surgical History:  Procedure Laterality Date  . ADENOIDECTOMY    . CERCLAGE REMOVAL    . CERVICAL CERCLAGE  07/02/15  . CESAREAN SECTION     2 previous   . TONSILLECTOMY     Allergies  Allergen Reactions  . Lamictal [Lamotrigine] Rash    Trudie Buckler syndrome    No current facility-administered medications on file prior to encounter.   Current Outpatient Medications on File Prior to Encounter  Medication Sig Dispense Refill  . acetaminophen (TYLENOL) 325 MG tablet Take 650 mg by mouth every 6 (six) hours as needed for mild pain or headache.    Marland Kitchen amoxicillin-clavulanate (AUGMENTIN) 875-125 MG tablet Take 1 tablet by mouth 2 (two) times daily for 7 days. 14 tablet 0  . benzonatate (TESSALON PERLES) 100 MG capsule Take 1 capsule (100 mg total) by mouth every 6 (six) hours as needed for cough. 30 capsule 0  . ondansetron (ZOFRAN ODT) 4 MG disintegrating tablet 4mg  ODT q4 hours prn nausea/vomit 10 tablet 0  . Prenatal  Vit-Fe Fumarate-FA (PRENATAL MULTIVITAMIN) TABS tablet Take 1 tablet by mouth daily at 12 noon.    . promethazine-dextromethorphan (PROMETHAZINE-DM) 6.25-15 MG/5ML syrup Take 5 mLs by mouth 4 (four) times daily as needed for cough. 118 mL 0   Social History   Socioeconomic History  . Marital status: Legally Separated    Spouse name: Not on file  . Number of children: 2  . Years of education: Not on file  . Highest education level: GED or equivalent  Occupational History  . Not on file  Tobacco Use  . Smoking status: Current Every Day Smoker    Packs/day: 0.50    Types: Cigarettes    Start date: 06/04/2012  . Smokeless tobacco: Never Used  Vaping Use  . Vaping Use: Never used  Substance and Sexual Activity  . Alcohol use: Not Currently  . Drug use: No  . Sexual activity: Yes    Birth control/protection: Implant  Other Topics Concern  . Not on file  Social History Narrative  . Not on file   Social Determinants of Health   Financial Resource Strain: Not on file  Food Insecurity: Not on file  Transportation Needs: Not on file  Physical Activity: Not on file  Stress: Not on file  Social Connections: Not on file  Intimate Partner Violence: Not on file   Family History  Problem Relation Age of Onset  . Diabetes Father   .  Diabetes Paternal Aunt   . Diabetes Paternal Grandmother     OBJECTIVE:  Vitals:   08/15/20 0855  BP: 119/84  Pulse: (!) 127  Resp: 20  Temp: 99.7 F (37.6 C)  TempSrc: Oral  SpO2: 97%     General appearance: alert; appears fatigued, but nontoxic, speaking in full sentences and managing own secretions HEENT: NCAT; Ears: EACs clear, TMs pearly gray with visible cone of light, without erythema; Eyes: PERRL, EOMI grossly; Nose: no obvious rhinorrhea; Throat: oropharynx clear, tonsils 1+ and mildly erythematous without white tonsillar exudates, uvula midline Neck: supple without LAD Lungs: CTA bilaterally without adventitious breath sounds; cough  present Heart: regular rate and rhythm.  Radial pulses 2+ symmetrical bilaterally Skin: warm and dry Psychological: alert and cooperative; normal mood and affect  LABS: No results found for this or any previous visit (from the past 24 hour(s)).   ASSESSMENT & PLAN:  1. Sore throat     Meds ordered this encounter  Medications  . cetirizine-pseudoephedrine (ZYRTEC-D) 5-120 MG tablet    Sig: Take 1 tablet by mouth daily.    Dispense:  30 tablet    Refill:  0  . lidocaine (XYLOCAINE) 2 % solution    Sig: Use as directed 15 mLs in the mouth or throat as needed for mouth pain.    Dispense:  100 mL    Refill:  1   Patient is stable at discharge.  She is currently taking Augmentin for the past 2 days.  She was advised to continue to take this medication and to completion.  We will add lidocaine mouthwash and Zyrtec-D for symptomatic relief  Discharge instructions  Get plenty of rest and push fluids Lidocaine mouthwash was prescribed Zyrtec D prescribed. Use daily for symptomatic relief Use OTC throat lozenges such as Halls, Vicks or Cepacol to sore throat Viscous lidocaine prescribed.  This is an oral solution you can swish, and gargle as needed for symptomatic relief of sore throat.  Do not exceed 8 doses in a 24 hour period.  Do not use prior to eating, as this will numb your entire mouth.   Drink warm or cool liquids, use throat lozenges, or popsicles to help alleviate symptoms Take OTC ibuprofen or tylenol as needed for pain Follow up with PCP if symptoms persists Return or go to ER if patient has any new or worsening symptoms such as fever, chills, nausea, vomiting, worsening sore throat, cough, abdominal pain, chest pain, changes in bowel or bladder habits, etc...  Reviewed expectations re: course of current medical issues. Questions answered. Outlined signs and symptoms indicating need for more acute intervention. Patient verbalized understanding. After Visit Summary  given.        Durward Parcel, FNP 08/15/20 0910    Durward Parcel, FNP 08/15/20 [phone]

## 2020-08-15 NOTE — ED Triage Notes (Signed)
Pt presents with ongoing sore throat and headache for past few days.

## 2020-08-23 ENCOUNTER — Encounter: Payer: Self-pay | Admitting: Emergency Medicine

## 2020-08-23 ENCOUNTER — Other Ambulatory Visit: Payer: Self-pay

## 2020-08-23 ENCOUNTER — Ambulatory Visit
Admission: EM | Admit: 2020-08-23 | Discharge: 2020-08-23 | Disposition: A | Discharge status: Home / Self Care | Payer: Medicaid Other | Attending: Family Medicine | Admitting: Family Medicine

## 2020-08-23 DIAGNOSIS — H66005 Acute suppurative otitis media without spontaneous rupture of ear drum, recurrent, left ear: Secondary | ICD-10-CM

## 2020-08-23 MED ORDER — CEFDINIR 300 MG PO CAPS: 300.0000 mg | ORAL_CAPSULE | Freq: Two times a day (BID) | ORAL | 0 refills | Status: AC

## 2020-08-23 MED ORDER — KETOROLAC TROMETHAMINE 30 MG/ML IJ SOLN
30.0000 mg | Freq: Once | INTRAMUSCULAR | Status: AC
  Administered 2020-08-23: 30 mg via INTRAMUSCULAR

## 2020-08-23 MED ORDER — FLUCONAZOLE 150 MG PO TABS: ORAL_TABLET | ORAL | 0 refills | Status: DC

## 2020-08-23 NOTE — ED Triage Notes (Signed)
Was on antibiotic for bilateral ear infection.  States her left ear is hurting worse now.

## 2020-08-23 NOTE — ED Provider Notes (Addendum)
[facility]    CSN: [id number] Arrival date & time: 08/23/20  1116      History   Chief Complaint No chief complaint on file.   HPI Dana Simpson is a 30 y.o. female.   Reports that she is having continued ear and throat pain for the last 2 weeks. Was seen in this office and was treated with augmentin for bilateral otitis media. Then she was seen in the ER and treated with steroids. States that she has been feeling worse over the last few days. Has not taken OTC medications for this. Denies headache, cough, nausea, vomiting, diarrhea, rash, fever, other symptoms.  ROS per HPI  The history is provided by the patient.    Past Medical History:  Diagnosis Date  . Depression   . Migraines   . Spinal headache    after cerclage with second pregnancy    Patient Active Problem List   Diagnosis Date Noted  . Nausea with vomiting 08/23/2013  . Acute pharyngitis 08/23/2013  . Unspecified episodic mood disorder 07/23/2013  . GERD (gastroesophageal reflux disease) 06/11/2013  . Obesity, unspecified 06/11/2013  . Depression 10/02/2012  . Eczema 08/11/2012  . History of drug dependence (HCC) 05/22/2012  . Anxiety 05/22/2012  . Migraine headache 05/22/2012  . History of suicide attempt 05/22/2012  . TOBACCO ABUSE 01/15/2008    Past Surgical History:  Procedure Laterality Date  . ADENOIDECTOMY    . CERCLAGE REMOVAL    . CERVICAL CERCLAGE  07/02/15  . CESAREAN SECTION     2 previous   . TONSILLECTOMY      OB History    Gravida  3   Para  3   Term  2   Preterm  1   AB  0   Living  2     SAB  0   IAB  0   Ectopic  0   Multiple  0   Live Births  2            Home Medications    Prior to Admission medications   Medication Sig Start Date End Date Taking? Authorizing Provider  cefdinir (OMNICEF) 300 MG capsule Take 1 capsule (300 mg total) by mouth 2 (two) times daily for 10 days. 08/23/20 09/02/20 Yes Moshe Cipro, NP   fluconazole (DIFLUCAN) 150 MG tablet Take one tablet at the onset of symptoms. If symptoms are still present 3 days later, take the second tablet. 08/23/20  Yes Moshe Cipro, NP  acetaminophen (TYLENOL) 325 MG tablet Take 650 mg by mouth every 6 (six) hours as needed for mild pain or headache.    [provider]  benzonatate (TESSALON PERLES) 100 MG capsule Take 1 capsule (100 mg total) by mouth every 6 (six) hours as needed for cough. 05/12/20 05/12/21  Elson Areas, PA-C  cetirizine-pseudoephedrine (ZYRTEC-D) 5-120 MG tablet Take 1 tablet by mouth daily. 08/15/20   Avegno, Zachery Dakins, FNP  lidocaine (XYLOCAINE) 2 % solution Use as directed 15 mLs in the mouth or throat as needed for mouth pain. 08/15/20   Durward Parcel, FNP  ondansetron (ZOFRAN ODT) 4 MG disintegrating tablet 4mg  ODT q4 hours prn nausea/vomit 07/22/20   07/24/20, PA-C  Prenatal Vit-Fe Fumarate-FA (PRENATAL MULTIVITAMIN) TABS tablet Take 1 tablet by mouth daily at 12 noon.    [provider]  promethazine-dextromethorphan (PROMETHAZINE-DM) 6.25-15 MG/5ML syrup Take 5 mLs by mouth 4 (four) times daily as needed for cough. 08/13/20  Moshe Cipro, NP    Family History Family History  Problem Relation Age of Onset  . Diabetes Father   . Diabetes Paternal Aunt   . Diabetes Paternal Grandmother     Social History Social History   Tobacco Use  . Smoking status: Current Every Day Smoker    Packs/day: 0.50    Types: Cigarettes    Start date: 06/04/2012  . Smokeless tobacco: Never Used  Vaping Use  . Vaping Use: Never used  Substance Use Topics  . Alcohol use: Not Currently  . Drug use: No     Allergies   Lamictal [lamotrigine]   Review of Systems Review of Systems   Physical Exam Triage Vital Signs ED Triage Vitals [08/23/20 1152]  Enc Vitals Group     BP 122/87     Pulse Rate (!) 106     Resp 18     Temp 98.6 F (37 C)     Temp Source Oral     SpO2 96 %     Weight       Height      Head Circumference      Peak Flow      Pain Score      Pain Loc      Pain Edu?      Excl. in GC?    No data found.  Updated Vital Signs BP 122/87 (BP Location: Right Arm)   Pulse (!) 106   Temp 98.6 F (37 C) (Oral)   Resp 18   SpO2 96%     Physical Exam Vitals and nursing note reviewed.  Constitutional:      General: She is not in acute distress.    Appearance: She is well-developed and well-nourished.  HENT:     Head: Normocephalic and atraumatic.     Right Ear: Ear canal and external ear normal. A middle ear effusion is present. Tympanic membrane is erythematous and bulging.     Left Ear: Tympanic membrane, ear canal and external ear normal.  Eyes:     Conjunctiva/sclera: Conjunctivae normal.  Cardiovascular:     Rate and Rhythm: Normal rate and regular rhythm.     Heart sounds: No murmur heard.   Pulmonary:     Effort: Pulmonary effort is normal. No respiratory distress.     Breath sounds: Normal breath sounds.  Abdominal:     Palpations: Abdomen is soft.     Tenderness: There is no abdominal tenderness.  Musculoskeletal:        General: No edema.     Cervical back: Neck supple.  Skin:    General: Skin is warm and dry.  Neurological:     Mental Status: She is alert.  Psychiatric:        Mood and Affect: Mood and affect normal.      UC Treatments / Results  Labs (all labs ordered are listed, but only abnormal results are displayed) Labs Reviewed - No data to display  EKG   Radiology No results found.  Procedures Procedures (including critical care time)  Medications Ordered in UC Medications  ketorolac (TORADOL) 30 MG/ML injection 30 mg (30 mg Intramuscular Given 08/23/20 1230)    Initial Impression / Assessment and Plan / UC Course  I have reviewed the triage vital signs and the nursing notes.  Pertinent labs & imaging results that were available during my care of the patient were reviewed by me and considered in my  medical decision making (see chart for  details).    Recurrent Left Otitis Media  Toradol 30mg  IM in office today Prescribed Cefdinir BID x 7 days Prescribed fluconazole in case of yeast Follow up with this office or with primary care if symptoms are persisting.  Follow up in the ER for high fever, trouble swallowing, trouble breathing, other concerning symptoms.  Final Clinical Impressions(s) / UC Diagnoses   Final diagnoses:  Recurrent acute suppurative otitis media without spontaneous rupture of left tympanic membrane     Discharge Instructions     I have sent in Cefdinir for you to take twice a day for 7 days  I have sent in fluconazole in case of yeast. Take one tablet at the onset of symptoms. If symptoms are still present in 3 days, take the second tablet.   You have received toradol in the office today for pain  Follow up with this office or with primary care if symptoms are persisting.  Follow up in the ER for high fever, trouble swallowing, trouble breathing, other concerning symptoms.       ED Prescriptions    Medication Sig Dispense Auth. Provider   cefdinir (OMNICEF) 300 MG capsule Take 1 capsule (300 mg total) by mouth 2 (two) times daily for 10 days. 20 capsule , NP   fluconazole (DIFLUCAN) 150 MG tablet Take one tablet at the onset of symptoms. If symptoms are still present 3 days later, take the second tablet. 2 tablet Moshe Cipro, NP     PDMP not reviewed this encounter.   Moshe Cipro, NP 08/23/20 1501    08/25/20, NP 08/23/20 1502

## 2020-08-23 NOTE — Discharge Instructions (Addendum)
I have sent in Cefdinir for you to take twice a day for 7 days  I have sent in fluconazole in case of yeast. Take one tablet at the onset of symptoms. If symptoms are still present in 3 days, take the second tablet.   You have received toradol in the office today for pain  Follow up with this office or with primary care if symptoms are persisting.  Follow up in the ER for high fever, trouble swallowing, trouble breathing, other concerning symptoms.

## 2020-10-22 ENCOUNTER — Other Ambulatory Visit: Payer: Self-pay

## 2020-10-22 ENCOUNTER — Ambulatory Visit
Admission: EM | Admit: 2020-10-22 | Discharge: 2020-10-22 | Disposition: A | Discharge status: Home / Self Care | Payer: Medicaid Other | Attending: Emergency Medicine | Admitting: Emergency Medicine

## 2020-10-22 DIAGNOSIS — J069 Acute upper respiratory infection, unspecified: Secondary | ICD-10-CM | POA: Diagnosis not present

## 2020-10-22 DIAGNOSIS — R350 Frequency of micturition: Secondary | ICD-10-CM | POA: Diagnosis not present

## 2020-10-22 LAB — POCT URINALYSIS DIP (MANUAL ENTRY)
Bilirubin, UA: NEGATIVE
Blood, UA: NEGATIVE
Glucose, UA: NEGATIVE mg/dL
Ketones, POC UA: NEGATIVE mg/dL
Nitrite, UA: POSITIVE — AB
Protein Ur, POC: 30 mg/dL — AB
Spec Grav, UA: 1.025 (ref 1.010–1.025)
Urobilinogen, UA: 0.2 E.U./dL
pH, UA: 5.5 (ref 5.0–8.0)

## 2020-10-22 LAB — POCT URINE PREGNANCY: Preg Test, Ur: NEGATIVE

## 2020-10-22 MED ORDER — BENZONATATE 100 MG PO CAPS: 100.0000 mg | ORAL_CAPSULE | Freq: Three times a day (TID) | ORAL | 0 refills | Status: DC

## 2020-10-22 MED ORDER — CEPHALEXIN 500 MG PO CAPS: 500.0000 mg | ORAL_CAPSULE | Freq: Two times a day (BID) | ORAL | 0 refills | Status: AC

## 2020-10-22 MED ORDER — PHENAZOPYRIDINE HCL 200 MG PO TABS: 200.0000 mg | ORAL_TABLET | Freq: Three times a day (TID) | ORAL | 0 refills | Status: DC

## 2020-10-22 NOTE — ED Provider Notes (Signed)
[facility]   [id number] 10/22/20 Arrival Time: 1150   CC: cold UTI symptoms  SUBJECTIVE: History from: patient.  JAEDYNN BOHLKEN is a 30 y.o. female who presents with sinus congestion, cough, sore throat, fatigue, headache, and vomiting x 4-5 days.  Reports sick exposure at work.  Has tried OTC medications without relief.  Reports previous symptoms in the past.   Denies fever, SOB, wheezing, chest pain, nausea, changes in bowel habits.    Also reports urinary frequency and urgency x 1 week.  Denies precipitating event or alleviating factors.  Worse with urination.  Reports similar symptoms in the past.  Denies abdominal or flank pain.    ROS: As per HPI.  All other pertinent ROS negative.     Past Medical History:  Diagnosis Date  . Depression   . Migraines   . Spinal headache    after cerclage with second pregnancy   Past Surgical History:  Procedure Laterality Date  . ADENOIDECTOMY    . CERCLAGE REMOVAL    . CERVICAL CERCLAGE  07/02/15  . CESAREAN SECTION     2 previous   . TONSILLECTOMY     Allergies  Allergen Reactions  . Lamictal [Lamotrigine] Rash    Trudie Buckler syndrome    No current facility-administered medications on file prior to encounter.   Current Outpatient Medications on File Prior to Encounter  Medication Sig Dispense Refill  . acetaminophen (TYLENOL) 325 MG tablet Take 650 mg by mouth every 6 (six) hours as needed for mild pain or headache.    . Prenatal Vit-Fe Fumarate-FA (PRENATAL MULTIVITAMIN) TABS tablet Take 1 tablet by mouth daily at 12 noon.     Social History   Socioeconomic History  . Marital status: Legally Separated    Spouse name: Not on file  . Number of children: 2  . Years of education: Not on file  . Highest education level: GED or equivalent  Occupational History  . Not on file  Tobacco Use  . Smoking status: Current Every Day Smoker    Packs/day: 0.50    Types: Cigarettes    Start date: 06/04/2012  .  Smokeless tobacco: Never Used  Vaping Use  . Vaping Use: Never used  Substance and Sexual Activity  . Alcohol use: Not Currently  . Drug use: No  . Sexual activity: Yes    Birth control/protection: Implant  Other Topics Concern  . Not on file  Social History Narrative  . Not on file   Social Determinants of Health   Financial Resource Strain: Not on file  Food Insecurity: Not on file  Transportation Needs: Not on file  Physical Activity: Not on file  Stress: Not on file  Social Connections: Not on file  Intimate Partner Violence: Not on file   Family History  Problem Relation Age of Onset  . Diabetes Father   . Diabetes Paternal Aunt   . Diabetes Paternal Grandmother     OBJECTIVE:  Vitals:   10/22/20 1234  BP: 122/87  Pulse: (!) 108  Resp: 19  Temp: 98.8 F (37.1 C)  SpO2: 97%     General appearance: alert; appears fatigued, but nontoxic; speaking in full sentences and tolerating own secretions HEENT: NCAT; Ears: EACs clear, TMs pearly gray; Eyes: PERRL.  EOM grossly intact. Sinuses: nontender; Nose: nares patent with clear rhinorrhea, Throat: oropharynx clear, tonsils erythematous and mildly enlarged, uvula midline  Neck: supple without LAD Lungs: unlabored respirations, symmetrical air entry; cough: mild; no respiratory  distress; CTAB Heart: regular rate and rhythm.   Abdomen: soft, nondistended, normal active bowel sounds; nontender to palpation; no guarding  Skin: warm and dry Psychological: alert and cooperative; normal mood and affect  LABS:  Results for orders placed or performed during the hospital encounter of 10/22/20 (from the past 24 hour(s))  POCT urinalysis dipstick     Status: Abnormal   Collection Time: 10/22/20 12:59 PM  Result Value Ref Range   Color, UA straw (A) yellow   Clarity, UA cloudy (A) clear   Glucose, UA negative negative mg/dL   Bilirubin, UA negative negative   Ketones, POC UA negative negative mg/dL   Spec Grav, UA 3.825  1.010 - 1.025   Blood, UA negative negative   pH, UA 5.5 5.0 - 8.0   Protein Ur, POC =30 (A) negative mg/dL   Urobilinogen, UA 0.2 0.2 or 1.0 E.U./dL   Nitrite, UA Positive (A) Negative   Leukocytes, UA Trace (A) Negative  POCT urine pregnancy     Status: None   Collection Time: 10/22/20 12:59 PM  Result Value Ref Range   Preg Test, Ur Negative Negative     ASSESSMENT & PLAN:  1. Viral URI with cough   2. Urinary frequency     Meds ordered this encounter  Medications  . cephALEXin (KEFLEX) 500 MG capsule    Sig: Take 1 capsule (500 mg total) by mouth 2 (two) times daily for 10 days.    Dispense:  20 capsule    Refill:  0    Order Specific Question:   Supervising Provider    Answer:   Eustace Moore [[id number]]  . phenazopyridine (PYRIDIUM) 200 MG tablet    Sig: Take 1 tablet (200 mg total) by mouth 3 (three) times daily.    Dispense:  6 tablet    Refill:  0    Order Specific Question:   Supervising Provider    Answer:   Eustace Moore [[id number]]  . benzonatate (TESSALON) 100 MG capsule    Sig: Take 1 capsule (100 mg total) by mouth every 8 (eight) hours.    Dispense:  21 capsule    Refill:  0    Order Specific Question:   Supervising Provider    Answer:   Eustace Moore [[id number]]    Get plenty of rest and push fluids Tessalon Perles prescribed for cough Use OTC zyrtec for nasal congestion, runny nose, and/or sore throat Use OTC flonase for nasal congestion and runny nose Use medications daily for symptom relief Use OTC medications like ibuprofen or tylenol as needed fever or pain Keflex and pyridium for UTI. Culture sent Call or go to the ED if you have any new or worsening symptoms such as fever, worsening cough, shortness of breath, chest tightness, chest pain, turning blue, changes in mental status, abdominal pain, flank pain, etc...   Reviewed expectations re: course of current medical issues. Questions answered. Outlined signs and symptoms indicating  need for more acute intervention. Patient verbalized understanding. After Visit Summary given.         Rennis Harding, PA-C 10/22/20 1329

## 2020-10-22 NOTE — ED Triage Notes (Signed)
Pt reports with complaints of cough, chest congestion, fever, headache, and emesis since Sunday. Pt is also concerned for a UTI and possible pregnancy. Pt reports using otc flu medication with no relief.

## 2020-10-22 NOTE — Discharge Instructions (Addendum)
Get plenty of rest and push fluids Tessalon Perles prescribed for cough Use OTC zyrtec for nasal congestion, runny nose, and/or sore throat Use OTC flonase for nasal congestion and runny nose Use medications daily for symptom relief Use OTC medications like ibuprofen or tylenol as needed fever or pain Keflex and pyridium for UTI. Culture sent Call or go to the ED if you have any new or worsening symptoms such as fever, worsening cough, shortness of breath, chest tightness, chest pain, turning blue, changes in mental status, abdominal pain, flank pain, etc...   Urine pregnancy negative

## 2020-10-25 LAB — URINE CULTURE: Culture: >=100000 — AB

## 2020-11-17 ENCOUNTER — Other Ambulatory Visit: Payer: Self-pay

## 2020-11-17 ENCOUNTER — Ambulatory Visit
Admission: EM | Admit: 2020-11-17 | Discharge: 2020-11-17 | Disposition: A | Discharge status: Home / Self Care | Payer: Medicaid Other | Attending: Emergency Medicine | Admitting: Emergency Medicine

## 2020-11-17 DIAGNOSIS — N898 Other specified noninflammatory disorders of vagina: Secondary | ICD-10-CM | POA: Diagnosis present

## 2020-11-17 DIAGNOSIS — L304 Erythema intertrigo: Secondary | ICD-10-CM

## 2020-11-17 DIAGNOSIS — Z113 Encounter for screening for infections with a predominantly sexual mode of transmission: Secondary | ICD-10-CM | POA: Diagnosis present

## 2020-11-17 LAB — POCT URINE PREGNANCY: Preg Test, Ur: NEGATIVE

## 2020-11-17 LAB — POCT URINALYSIS DIP (MANUAL ENTRY)
Bilirubin, UA: NEGATIVE
Glucose, UA: NEGATIVE mg/dL
Ketones, POC UA: NEGATIVE mg/dL
Leukocytes, UA: NEGATIVE
Nitrite, UA: NEGATIVE
Protein Ur, POC: NEGATIVE mg/dL
Spec Grav, UA: 1.01 (ref 1.010–1.025)
Urobilinogen, UA: 0.2 E.U./dL
pH, UA: 5.5 (ref 5.0–8.0)

## 2020-11-17 MED ORDER — NYSTATIN 100000 UNIT/GM EX POWD: 1.0000 "application " | Freq: Three times a day (TID) | CUTANEOUS | 0 refills | Status: DC

## 2020-11-17 MED ORDER — CEFTRIAXONE SODIUM 500 MG IJ SOLR
500.0000 mg | Freq: Once | INTRAMUSCULAR | Status: AC
  Administered 2020-11-17: 500 mg via INTRAMUSCULAR

## 2020-11-17 MED ORDER — DOXYCYCLINE HYCLATE 100 MG PO CAPS: 100.0000 mg | ORAL_CAPSULE | Freq: Two times a day (BID) | ORAL | 0 refills | Status: AC

## 2020-11-17 NOTE — ED Triage Notes (Signed)
Pt presents with rash on abdomen from wearing waist trainer

## 2020-11-17 NOTE — ED Provider Notes (Signed)
[facility]    CSN: [id number] Arrival date & time: 11/17/20  0916      History   Chief Complaint Chief Complaint  Patient presents with  . Rash    HPI Dana Simpson is a 30 y.o. female.   Dana Simpson presents with complaints of abdominal skin rash for the past few days. She has been wearing a waist trainer to work out in, and then wearing it to work after. Itches. Some irritation as well. Also with complaints of pain with intercourse as well as vaginal discharge. No pelvic pain or back pain. No fevers. Has had std's in the past and she is concerned about this. She has noted abnormal color to her urine, but no pain with urination or urinary frequency. No fevers. She is sexually active with 3 partners, doesn't always use condoms. She is on birth control.    ROS per HPI, negative if not otherwise mentioned.      Past Medical History:  Diagnosis Date  . Depression   . Migraines   . Spinal headache    after cerclage with second pregnancy    Patient Active Problem List   Diagnosis Date Noted  . Nausea with vomiting 08/23/2013  . Acute pharyngitis 08/23/2013  . Unspecified episodic mood disorder 07/23/2013  . GERD (gastroesophageal reflux disease) 06/11/2013  . Obesity, unspecified 06/11/2013  . Depression 10/02/2012  . Eczema 08/11/2012  . History of drug dependence (HCC) 05/22/2012  . Anxiety 05/22/2012  . Migraine headache 05/22/2012  . History of suicide attempt 05/22/2012  . TOBACCO ABUSE 01/15/2008    Past Surgical History:  Procedure Laterality Date  . ADENOIDECTOMY    . CERCLAGE REMOVAL    . CERVICAL CERCLAGE  07/02/15  . CESAREAN SECTION     2 previous   . TONSILLECTOMY      OB History    Gravida  3   Para  3   Term  2   Preterm  1   AB  0   Living  2     SAB  0   IAB  0   Ectopic  0   Multiple  0   Live Births  2            Home Medications    Prior to Admission medications   Medication Sig Start  Date End Date Taking? Authorizing Provider  doxycycline (VIBRAMYCIN) 100 MG capsule Take 1 capsule (100 mg total) by mouth 2 (two) times daily for 7 days. 11/17/20 11/24/20 Yes Holliday Sheaffer, Dorene Grebe B, NP  nystatin (MYCOSTATIN/NYSTOP) powder Apply 1 application topically 3 (three) times daily. 11/17/20  Yes Linus Mako B, NP  acetaminophen (TYLENOL) 325 MG tablet Take 650 mg by mouth every 6 (six) hours as needed for mild pain or headache.    [provider]  benzonatate (TESSALON) 100 MG capsule Take 1 capsule (100 mg total) by mouth every 8 (eight) hours. 10/22/20   Wurst, [place], PA-C  phenazopyridine (PYRIDIUM) 200 MG tablet Take 1 tablet (200 mg total) by mouth 3 (three) times daily. 10/22/20   Wurst, [place], PA-C  Prenatal Vit-Fe Fumarate-FA (PRENATAL MULTIVITAMIN) TABS tablet Take 1 tablet by mouth daily at 12 noon.    [provider]    Family History Family History  Problem Relation Age of Onset  . Diabetes Father   . Diabetes Paternal Aunt   . Diabetes Paternal Grandmother     Social History Social History   Tobacco Use  .  Smoking status: Current Every Day Smoker    Packs/day: 0.50    Types: Cigarettes    Start date: 06/04/2012  . Smokeless tobacco: Never Used  Vaping Use  . Vaping Use: Never used  Substance Use Topics  . Alcohol use: Not Currently  . Drug use: No     Allergies   Lamictal [lamotrigine]   Review of Systems Review of Systems   Physical Exam Triage Vital Signs ED Triage Vitals  Enc Vitals Group     BP 11/17/20 1048 117/80     Pulse Rate 11/17/20 1048 81     Resp 11/17/20 1048 20     Temp 11/17/20 1048 98.2 F (36.8 C)     Temp src --      SpO2 11/17/20 1048 94 %     Weight --      Height --      Head Circumference --      Peak Flow --      Pain Score 11/17/20 1046 0     Pain Loc --      Pain Edu? --      Excl. in GC? --    No data found.  Updated Vital Signs BP 117/80   Pulse 81   Temp 98.2 F (36.8 C)   Resp 20    SpO2 94%   Visual Acuity Right Eye Distance:   Left Eye Distance:   Bilateral Distance:    Right Eye Near:   Left Eye Near:    Bilateral Near:     Physical Exam Constitutional:      General: She is not in acute distress.    Appearance: She is well-developed.  Cardiovascular:     Rate and Rhythm: Normal rate.  Pulmonary:     Effort: Pulmonary effort is normal.  Abdominal:     Tenderness: There is no abdominal tenderness. There is no right CVA tenderness or left CVA tenderness.  Skin:    General: Skin is warm and dry.     Comments: Red rash with defined edges isolated to within skin fold of upper abdomen   Neurological:     Mental Status: She is alert and oriented to person, place, and time.      UC Treatments / Results  Labs (all labs ordered are listed, but only abnormal results are displayed) Labs Reviewed  POCT URINALYSIS DIP (MANUAL ENTRY) - Abnormal; Notable for the following components:      Result Value   Blood, UA trace-intact (*)    All other components within normal limits  RPR  HIV ANTIBODY (ROUTINE TESTING W REFLEX)  POCT URINE PREGNANCY  CERVICOVAGINAL ANCILLARY ONLY    EKG   Radiology No results found.  Procedures Procedures (including critical care time)  Medications Ordered in UC Medications  cefTRIAXone (ROCEPHIN) injection 500 mg (500 mg Intramuscular Given 11/17/20 1138)    Initial Impression / Assessment and Plan / UC Course  I have reviewed the triage vital signs and the nursing notes.  Pertinent labs & imaging results that were available during my care of the patient were reviewed by me and considered in my medical decision making (see chart for details).     Empiric std treatment initiated today given patient's complaints. Encouraged to stop use of the waist trainer until rash has resolved, with treatment plan provided and discussed. Patient verbalized understanding and agreeable to plan.   Final Clinical Impressions(s) / UC  Diagnoses   Final diagnoses:  Screen for STD (  sexually transmitted disease)  Intertrigo     Discharge Instructions     Use of the provided powder to your skin rash, you can also use over the counter Lotrimin cream.  Do not use your waist trainer until this has resolved and be sure to clean this very well as it can harbor fungus.  Please withhold from intercourse for the next week. Please use condoms to prevent STD's.   Complete course of antibiotics. We will call you to confirm any positive testing from your screening.     ED Prescriptions    Medication Sig Dispense Auth. Provider   nystatin (MYCOSTATIN/NYSTOP) powder Apply 1 application topically 3 (three) times daily. 15 g Linus Mako B, NP   doxycycline (VIBRAMYCIN) 100 MG capsule Take 1 capsule (100 mg total) by mouth 2 (two) times daily for 7 days. 14 capsule Georgetta Haber, NP     PDMP not reviewed this encounter.   Georgetta Haber, NP 11/17/20 1441

## 2020-11-17 NOTE — Discharge Instructions (Addendum)
Use of the provided powder to your skin rash, you can also use over the counter Lotrimin cream.  Do not use your waist trainer until this has resolved and be sure to clean this very well as it can harbor fungus.  Please withhold from intercourse for the next week. Please use condoms to prevent STD's.   Complete course of antibiotics. We will call you to confirm any positive testing from your screening.

## 2020-11-17 NOTE — ED Triage Notes (Signed)
Pt also wants std screening

## 2020-11-18 LAB — CERVICOVAGINAL ANCILLARY ONLY
Bacterial Vaginitis (gardnerella): POSITIVE — AB
Candida Glabrata: NEGATIVE
Candida Vaginitis: NEGATIVE
Chlamydia: POSITIVE — AB
Comment: NEGATIVE
Comment: NEGATIVE
Comment: NEGATIVE
Comment: NEGATIVE
Comment: NEGATIVE
Comment: NORMAL
Neisseria Gonorrhea: NEGATIVE
Trichomonas: NEGATIVE

## 2020-11-19 ENCOUNTER — Telehealth (HOSPITAL_COMMUNITY): Payer: Self-pay | Admitting: Emergency Medicine

## 2020-11-19 LAB — HIV ANTIBODY (ROUTINE TESTING W REFLEX): HIV Screen 4th Generation wRfx: NONREACTIVE

## 2020-11-19 LAB — RPR: RPR Ser Ql: NONREACTIVE

## 2020-11-19 MED ORDER — METRONIDAZOLE 500 MG PO TABS: 500.0000 mg | ORAL_TABLET | Freq: Two times a day (BID) | ORAL | 0 refills | Status: DC

## 2020-11-20 ENCOUNTER — Telehealth (HOSPITAL_COMMUNITY): Payer: Self-pay | Admitting: Emergency Medicine

## 2020-11-20 MED ORDER — METRONIDAZOLE 500 MG PO TABS: 500.0000 mg | ORAL_TABLET | Freq: Two times a day (BID) | ORAL | 0 refills | Status: DC

## 2020-11-20 NOTE — Telephone Encounter (Signed)
Patient needed prescription sent to different pharmacy

## 2020-12-15 ENCOUNTER — Other Ambulatory Visit: Payer: Self-pay

## 2020-12-15 ENCOUNTER — Encounter: Payer: Self-pay | Admitting: Emergency Medicine

## 2020-12-15 ENCOUNTER — Ambulatory Visit
Admission: EM | Admit: 2020-12-15 | Discharge: 2020-12-15 | Disposition: A | Discharge status: Home / Self Care | Payer: Medicaid Other | Attending: Family Medicine | Admitting: Family Medicine

## 2020-12-15 DIAGNOSIS — N760 Acute vaginitis: Secondary | ICD-10-CM | POA: Insufficient documentation

## 2020-12-15 DIAGNOSIS — N3000 Acute cystitis without hematuria: Secondary | ICD-10-CM | POA: Insufficient documentation

## 2020-12-15 LAB — POCT URINALYSIS DIP (MANUAL ENTRY)
Glucose, UA: NEGATIVE mg/dL
Ketones, POC UA: NEGATIVE mg/dL
Nitrite, UA: NEGATIVE
Spec Grav, UA: >=1.03 — AB (ref 1.010–1.025)
Urobilinogen, UA: 0.2 E.U./dL
pH, UA: 5 (ref 5.0–8.0)

## 2020-12-15 MED ORDER — SULFAMETHOXAZOLE-TRIMETHOPRIM 800-160 MG PO TABS: 1.0000 | ORAL_TABLET | Freq: Two times a day (BID) | ORAL | 0 refills | Status: DC

## 2020-12-15 MED ORDER — FLUCONAZOLE 150 MG PO TABS: 150.0000 mg | ORAL_TABLET | Freq: Once | ORAL | 0 refills | Status: AC

## 2020-12-15 NOTE — ED Provider Notes (Signed)
[facility]    CSN: [id number] Arrival date & time: 12/15/20  1028      History   Chief Complaint No chief complaint on file.   HPI Dana Simpson is a 30 y.o. female.   HPI Dana Simpson is a 30 y.o. female presents for evaluation of urinary frequency, urgency and dysuria x few  days, without flank pain, fever, chills. She endorses vaginal itching.  Reports no concern for STD. Previous history of UTI and reports failure Uncertain of prior urine pathology. Patient's last menstrual period was 11/11/2020.   Past Medical History:  Diagnosis Date   Depression    Migraines    Spinal headache    after cerclage with second pregnancy    Patient Active Problem List   Diagnosis Date Noted   Nausea with vomiting 08/23/2013   Acute pharyngitis 08/23/2013   Unspecified episodic mood disorder 07/23/2013   GERD (gastroesophageal reflux disease) 06/11/2013   Obesity, unspecified 06/11/2013   Depression 10/02/2012   Eczema 08/11/2012   History of drug dependence (HCC) 05/22/2012   Anxiety 05/22/2012   Migraine headache 05/22/2012   History of suicide attempt 05/22/2012   TOBACCO ABUSE 01/15/2008    Past Surgical History:  Procedure Laterality Date   ADENOIDECTOMY     CERCLAGE REMOVAL     CERVICAL CERCLAGE  07/02/15   CESAREAN SECTION     2 previous    TONSILLECTOMY      OB History     Gravida  3   Para  3   Term  2   Preterm  1   AB  0   Living  2      SAB  0   IAB  0   Ectopic  0   Multiple  0   Live Births  2            Home Medications    Prior to Admission medications   Medication Sig Start Date End Date Taking? Authorizing Provider  fluconazole (DIFLUCAN) 150 MG tablet Take 1 tablet (150 mg total) by mouth once for 1 dose. Repeat if needed 12/15/20 12/15/20 Yes Bing Neighbors, FNP  sulfamethoxazole-trimethoprim (BACTRIM DS) 800-160 MG tablet Take 1 tablet by mouth 2 (two) times daily. 12/15/20  Yes Bing Neighbors,  FNP  acetaminophen (TYLENOL) 325 MG tablet Take 650 mg by mouth every 6 (six) hours as needed for mild pain or headache.    [provider]  benzonatate (TESSALON) 100 MG capsule Take 1 capsule (100 mg total) by mouth every 8 (eight) hours. 10/22/20   Wurst, [place], PA-C  metroNIDAZOLE (FLAGYL) 500 MG tablet Take 1 tablet (500 mg total) by mouth 2 (two) times daily. 11/20/20   Lamptey, Britta Mccreedy, MD  nystatin (MYCOSTATIN/NYSTOP) powder Apply 1 application topically 3 (three) times daily. 11/17/20   Georgetta Haber, NP  phenazopyridine (PYRIDIUM) 200 MG tablet Take 1 tablet (200 mg total) by mouth 3 (three) times daily. 10/22/20   Wurst, [place], PA-C  Prenatal Vit-Fe Fumarate-FA (PRENATAL MULTIVITAMIN) TABS tablet Take 1 tablet by mouth daily at 12 noon.    [provider]    Family History Family History  Problem Relation Age of Onset   Diabetes Father    Diabetes Paternal Aunt    Diabetes Paternal Grandmother     Social History Social History   Tobacco Use   Smoking status: Every Day    Packs/day: 0.50    Pack years: 0.00  Types: Cigarettes    Start date: 06/04/2012   Smokeless tobacco: Never  Vaping Use   Vaping Use: Never used  Substance Use Topics   Alcohol use: Not Currently   Drug use: No     Allergies   Lamictal [lamotrigine]   Review of Systems Review of Systems Pertinent negatives listed in HPI   Physical Exam Triage Vital Signs ED Triage Vitals  Enc Vitals Group     BP 12/15/20 1038 109/66     Pulse Rate 12/15/20 1038 79     Resp 12/15/20 1038 16     Temp 12/15/20 1038 98.5 F (36.9 C)     Temp Source 12/15/20 1038 Oral     SpO2 12/15/20 1038 96 %     Weight --      Height --      Head Circumference --      Peak Flow --      Pain Score 12/15/20 1039 6     Pain Loc --      Pain Edu? --      Excl. in GC? --    No data found.  Updated Vital Signs BP 109/66 (BP Location: Right Arm)   Pulse 79   Temp 98.5 F (36.9 C) (Oral)    Resp 16   LMP 11/11/2020   SpO2 96%   Visual Acuity Right Eye Distance:   Left Eye Distance:   Bilateral Distance:    Right Eye Near:   Left Eye Near:    Bilateral Near:     Physical Exam  General appearance: alert, well developed, well nourished, cooperative  Head: Normocephalic, without obvious abnormality, atraumatic Respiratory: Respirations even and unlabored, normal respiratory rate Heart: Rate and rhythm normal. No gallop or murmurs noted on exam  Abdomen: BS +, no distention, no rebound tenderness, negative flank pain Extremities: No gross deformities Skin: Skin color, texture, turgor normal. No rashes seen  Psych: Appropriate mood and affect. Neurologic: GCS 15, normal coordination, normal gait  UC Treatments / Results  Labs (all labs ordered are listed, but only abnormal results are displayed) Labs Reviewed  POCT URINALYSIS DIP (MANUAL ENTRY) - Abnormal; Notable for the following components:      Result Value   Clarity, UA cloudy (*)    Bilirubin, UA small (*)    Spec Grav, UA >=1.030 (*)    Blood, UA large (*)    Protein Ur, POC trace (*)    Leukocytes, UA Large (3+) (*)    All other components within normal limits  URINE CULTURE  CERVICOVAGINAL ANCILLARY ONLY    EKG   Radiology No results found.  Procedures Procedures (including critical care time)  Medications Ordered in UC Medications - No data to display  Initial Impression / Assessment and Plan / UC Course  I have reviewed the triage vital signs and the nursing notes.  Pertinent labs & imaging results that were available during my care of the patient were reviewed by me and considered in my medical decision making (see chart for details).     UA abnormal and findings consistent with UTI. Empiric antibiotic treatment initiated.  Diflucan initiated for vaginal irritation. Urine culture pending. Vaginal cytology pending. ER if symptoms become severe. Follow-up with PCP if symptoms do not  completely resolve.  Final Clinical Impressions(s) / UC Diagnoses   Final diagnoses:  Acute cystitis without hematuria  Vaginitis and vulvovaginitis   Discharge Instructions   None    ED Prescriptions  Medication Sig Dispense Auth. Provider   sulfamethoxazole-trimethoprim (BACTRIM DS) 800-160 MG tablet Take 1 tablet by mouth 2 (two) times daily. 20 tablet Bing Neighbors, FNP   fluconazole (DIFLUCAN) 150 MG tablet Take 1 tablet (150 mg total) by mouth once for 1 dose. Repeat if needed 2 tablet Bing Neighbors, FNP      PDMP not reviewed this encounter.   Bing Neighbors, [place] 12/17/20 [phone]

## 2020-12-15 NOTE — ED Triage Notes (Signed)
Pain on urination and frequency along with itching x 2 days.

## 2020-12-16 LAB — CERVICOVAGINAL ANCILLARY ONLY
Bacterial Vaginitis (gardnerella): POSITIVE — AB
Candida Glabrata: NEGATIVE
Candida Vaginitis: POSITIVE — AB
Chlamydia: NEGATIVE
Comment: NEGATIVE
Comment: NEGATIVE
Comment: NEGATIVE
Comment: NEGATIVE
Comment: NEGATIVE
Comment: NORMAL
Neisseria Gonorrhea: NEGATIVE
Trichomonas: NEGATIVE

## 2020-12-17 LAB — URINE CULTURE

## 2020-12-18 ENCOUNTER — Telehealth (HOSPITAL_COMMUNITY): Payer: Self-pay | Admitting: Emergency Medicine

## 2020-12-18 MED ORDER — METRONIDAZOLE 500 MG PO TABS: 500.0000 mg | ORAL_TABLET | Freq: Two times a day (BID) | ORAL | 0 refills | Status: DC

## 2020-12-18 NOTE — Telephone Encounter (Signed)
Patient requested prescription be resent to a different pharmacy

## 2021-04-08 ENCOUNTER — Ambulatory Visit
Admission: EM | Admit: 2021-04-08 | Discharge: 2021-04-08 | Disposition: A | Discharge status: Home / Self Care | Payer: Self-pay | Attending: Family Medicine | Admitting: Family Medicine

## 2021-04-08 ENCOUNTER — Ambulatory Visit: Payer: Self-pay

## 2021-04-08 ENCOUNTER — Other Ambulatory Visit: Payer: Self-pay

## 2021-04-08 DIAGNOSIS — R110 Nausea: Secondary | ICD-10-CM | POA: Insufficient documentation

## 2021-04-08 DIAGNOSIS — N3090 Cystitis, unspecified without hematuria: Secondary | ICD-10-CM | POA: Insufficient documentation

## 2021-04-08 LAB — POCT URINALYSIS DIP (MANUAL ENTRY)
Bilirubin, UA: NEGATIVE
Blood, UA: NEGATIVE
Glucose, UA: NEGATIVE mg/dL
Ketones, POC UA: NEGATIVE mg/dL
Nitrite, UA: NEGATIVE
Protein Ur, POC: NEGATIVE mg/dL
Spec Grav, UA: 1.025 (ref 1.010–1.025)
Urobilinogen, UA: 0.2 E.U./dL
pH, UA: 5 (ref 5.0–8.0)

## 2021-04-08 MED ORDER — ONDANSETRON 4 MG PO TBDP: 4.0000 mg | ORAL_TABLET | Freq: Three times a day (TID) | ORAL | 0 refills | Status: DC | PRN

## 2021-04-08 MED ORDER — CEPHALEXIN 500 MG PO CAPS: 500.0000 mg | ORAL_CAPSULE | Freq: Two times a day (BID) | ORAL | 0 refills | Status: DC

## 2021-04-08 NOTE — ED Provider Notes (Signed)
[facility]    ASSESSMENT & PLAN:  1. Cystitis   2. Nausea without vomiting    Begin: Meds ordered this encounter  Medications   cephALEXin (KEFLEX) 500 MG capsule    Sig: Take 1 capsule (500 mg total) by mouth 2 (two) times daily.    Dispense:  10 capsule    Refill:  0   ondansetron (ZOFRAN-ODT) 4 MG disintegrating tablet    Sig: Take 1 tablet (4 mg total) by mouth every 8 (eight) hours as needed for nausea or vomiting.    Dispense:  15 tablet    Refill:  0   No signs of pyelonephritis. Urine culture sent.  Will follow up with her PCP or here if not showing improvement over the next 48 hours, sooner if needed.  Outlined signs and symptoms indicating need for more acute intervention. Patient verbalized understanding. After Visit Summary given.  SUBJECTIVE:  Dana Simpson is a 30 y.o. female who complains of urinary frequency, urgency and dysuria for the past sev days. Without associated flank pain, fever, chills. Gross hematuria: not present. No specific aggravating or alleviating factors reported. No LE edema. Normal PO intake without n/v/d. Without specific abdominal pain. Ambulatory without difficulty. OTC treatment: none.  LMP: Patient's last menstrual period was 03/03/2021 (exact date). 5 w pregnant.   OBJECTIVE:  Vitals:   04/08/21 1005  BP: 118/78  Pulse: 73  Resp: 19  Temp: 98.3 F (36.8 C)  SpO2: 97%   General appearance: alert; no distress HENT: oropharynx: moist Lungs: unlabored respirations Abdomen: soft, non-tender; bowel sounds normal; no masses or organomegaly; no guarding or rebound tenderness Back: no CVA tenderness Extremities: no edema; symmetrical with no gross deformities Skin: warm and dry Neurologic: normal gait Psychological: alert and cooperative; normal mood and affect  Labs Reviewed  POCT URINALYSIS DIP (MANUAL ENTRY) - Abnormal; Notable for the following components:      Result Value   Clarity, UA cloudy (*)     Leukocytes, UA Large (3+) (*)    All other components within normal limits  URINE CULTURE    Allergies  Allergen Reactions   Lamictal [Lamotrigine] Rash    Trudie Buckler syndrome     Past Medical History:  Diagnosis Date   Depression    Migraines    Spinal headache    after cerclage with second pregnancy   Social History   Socioeconomic History   Marital status: Legally Separated    Spouse name: Not on file   Number of children: 2   Years of education: Not on file   Highest education level: GED or equivalent  Occupational History   Not on file  Tobacco Use   Smoking status: Every Day    Packs/day: 0.50    Types: Cigarettes    Start date: 06/04/2012   Smokeless tobacco: Never  Vaping Use   Vaping Use: Never used  Substance and Sexual Activity   Alcohol use: Not Currently   Drug use: No   Sexual activity: Yes    Birth control/protection: Implant  Other Topics Concern   Not on file  Social History Narrative   Not on file   Social Determinants of Health   Financial Resource Strain: Not on file  Food Insecurity: Not on file  Transportation Needs: Not on file  Physical Activity: Not on file  Stress: Not on file  Social Connections: Not on file  Intimate Partner Violence: Not on file   Family History  Problem Relation  Age of Onset   Diabetes Father    Diabetes Paternal Aunt    Diabetes Paternal Dulce Sellar, MD 04/08/21 1038

## 2021-04-08 NOTE — Discharge Instructions (Signed)
You have had labs (urine culture) sent today. We will call you with any significant abnormalities or if there is need to begin or change treatment or pursue further follow up.  You may also review your test results online through MyChart. If you do not have a MyChart account, instructions to sign up should be on your discharge paperwork.  

## 2021-04-08 NOTE — ED Triage Notes (Signed)
Pt presents with vaginal pain. Reports she is having pain with sex, pain when washing the area, dysuria, vaginal discharge, and vaginal itching x 4 days. Pt is [redacted] weeks pregnant.

## 2021-04-10 LAB — URINE CULTURE

## 2021-04-13 ENCOUNTER — Ambulatory Visit: Payer: Self-pay

## 2021-04-13 ENCOUNTER — Ambulatory Visit
Admission: EM | Admit: 2021-04-13 | Discharge: 2021-04-13 | Disposition: A | Discharge status: Home / Self Care | Payer: Medicaid Other | Attending: Urgent Care | Admitting: Urgent Care

## 2021-04-13 ENCOUNTER — Other Ambulatory Visit: Payer: Self-pay

## 2021-04-13 DIAGNOSIS — R300 Dysuria: Secondary | ICD-10-CM | POA: Insufficient documentation

## 2021-04-13 DIAGNOSIS — Z3201 Encounter for pregnancy test, result positive: Secondary | ICD-10-CM | POA: Diagnosis present

## 2021-04-13 LAB — POCT URINALYSIS DIP (MANUAL ENTRY)
Bilirubin, UA: NEGATIVE
Blood, UA: NEGATIVE
Glucose, UA: NEGATIVE mg/dL
Ketones, POC UA: NEGATIVE mg/dL
Leukocytes, UA: NEGATIVE
Nitrite, UA: NEGATIVE
Protein Ur, POC: NEGATIVE mg/dL
Spec Grav, UA: 1.025 (ref 1.010–1.025)
Urobilinogen, UA: 0.2 E.U./dL
pH, UA: 6 (ref 5.0–8.0)

## 2021-04-13 LAB — POCT URINE PREGNANCY: Preg Test, Ur: POSITIVE — AB

## 2021-04-13 NOTE — ED Provider Notes (Signed)
[facility]   MRN: [id number] DOB: May 12, 1991  Subjective:   Dana Simpson is a 30 y.o. female presenting for 2-week history of persistent vaginal burning, dysuria and itching.  Patient was seen on 04/08/2021.  Urine culture was equivocal.  She had undergone a course of Keflex but still has her symptoms.  She did a cervical swab but the test was not run.  She plans to see her OB in November.  No current facility-administered medications for this encounter.  Current Outpatient Medications:    ondansetron (ZOFRAN-ODT) 4 MG disintegrating tablet, Take 1 tablet (4 mg total) by mouth every 8 (eight) hours as needed for nausea or vomiting., Disp: 15 tablet, Rfl: 0   acetaminophen (TYLENOL) 325 MG tablet, Take 650 mg by mouth every 6 (six) hours as needed for mild pain or headache., Disp: , Rfl:    benzonatate (TESSALON) 100 MG capsule, Take 1 capsule (100 mg total) by mouth every 8 (eight) hours., Disp: 21 capsule, Rfl: 0   cephALEXin (KEFLEX) 500 MG capsule, Take 1 capsule (500 mg total) by mouth 2 (two) times daily., Disp: 10 capsule, Rfl: 0   nystatin (MYCOSTATIN/NYSTOP) powder, Apply 1 application topically 3 (three) times daily., Disp: 15 g, Rfl: 0   phenazopyridine (PYRIDIUM) 200 MG tablet, Take 1 tablet (200 mg total) by mouth 3 (three) times daily., Disp: 6 tablet, Rfl: 0   Prenatal Vit-Fe Fumarate-FA (PRENATAL MULTIVITAMIN) TABS tablet, Take 1 tablet by mouth daily at 12 noon., Disp: , Rfl:    Allergies  Allergen Reactions   Lamictal [Lamotrigine] Rash    Trudie Buckler syndrome     Past Medical History:  Diagnosis Date   Depression    Migraines    Spinal headache    after cerclage with second pregnancy     Past Surgical History:  Procedure Laterality Date   ADENOIDECTOMY     CERCLAGE REMOVAL     CERVICAL CERCLAGE  07/02/15   CESAREAN SECTION     2 previous    TONSILLECTOMY      Family History  Problem Relation Age of Onset   Diabetes Father     Diabetes Paternal Aunt    Diabetes Paternal Grandmother     Social History   Tobacco Use   Smoking status: Former    Packs/day: 0.50    Types: Cigarettes    Start date: 06/04/2012   Smokeless tobacco: Never  Vaping Use   Vaping Use: Never used  Substance Use Topics   Alcohol use: Not Currently   Drug use: No    ROS   Objective:   Vitals: BP 111/73 (BP Location: Right Arm)   Pulse 81   Temp 98 F (36.7 C) (Oral)   Resp 16   LMP 03/03/2021 (Exact Date)   SpO2 97%   Physical Exam Constitutional:      General: She is not in acute distress.    Appearance: Normal appearance. She is well-developed. She is not ill-appearing, toxic-appearing or diaphoretic.  HENT:     Head: Normocephalic and atraumatic.     Nose: Nose normal.     Mouth/Throat:     Mouth: Mucous membranes are moist.     Pharynx: Oropharynx is clear.  Eyes:     General: No scleral icterus.       Right eye: No discharge.        Left eye: No discharge.     Extraocular Movements: Extraocular movements intact.     Conjunctiva/sclera: Conjunctivae normal.  Pupils: Pupils are equal, round, and reactive to light.  Cardiovascular:     Rate and Rhythm: Normal rate.  Pulmonary:     Effort: Pulmonary effort is normal.  Abdominal:     General: Bowel sounds are normal. There is no distension.     Palpations: Abdomen is soft. There is no mass.     Tenderness: There is no abdominal tenderness. There is no right CVA tenderness, left CVA tenderness, guarding or rebound.  Skin:    General: Skin is warm and dry.  Neurological:     General: No focal deficit present.     Mental Status: She is alert and oriented to person, place, and time.  Psychiatric:        Mood and Affect: Mood normal.        Behavior: Behavior normal.        Thought Content: Thought content normal.        Judgment: Judgment normal.    Results for orders placed or performed during the hospital encounter of 04/13/21 (from the past 24  hour(s))  POCT urinalysis dipstick     Status: None   Collection Time: 04/13/21  5:27 PM  Result Value Ref Range   Color, UA yellow yellow   Clarity, UA clear clear   Glucose, UA negative negative mg/dL   Bilirubin, UA negative negative   Ketones, POC UA negative negative mg/dL   Spec Grav, UA 5.188 4.166 - 1.025   Blood, UA negative negative   pH, UA 6.0 5.0 - 8.0   Protein Ur, POC negative negative mg/dL   Urobilinogen, UA 0.2 0.2 or 1.0 E.U./dL   Nitrite, UA Negative Negative   Leukocytes, UA Negative Negative  POCT urine pregnancy     Status: Abnormal   Collection Time: 04/13/21  5:27 PM  Result Value Ref Range   Preg Test, Ur Positive (A) Negative    Assessment and Plan :   PDMP not reviewed this encounter.  1. Dysuria   2. Positive pregnancy test     Given her recent test results, recommended waiting for today's results to base treatment off at that.  Use supportive care in the meantime. Counseled patient on potential for adverse effects with medications prescribed/recommended today, ER and return-to-clinic precautions discussed, patient verbalized understanding.    Wallis Bamberg, PA-C 04/13/21 0630

## 2021-04-13 NOTE — Discharge Instructions (Addendum)
Make sure you hydrate very well with plain water and a quantity of 64 ounces of water a day.  Please limit drinks that are considered urinary irritants such as soda, sweet tea, coffee, energy drinks, alcohol.  These can worsen your urinary and genital symptoms but also be the source of them.  I will let you know about your urine culture results through MyChart to see if we need to prescribe or change your antibiotics based off of those results. We will also let you know about your testing for sexually transmitted infections and yeast, BV.

## 2021-04-13 NOTE — ED Triage Notes (Signed)
Pt presents with vaginal burning and itching x 2 weeks.  Was tx for UTI recently and has completed medicine but no improvement in symptoms.  Had two episodes of white mucous discharge.  Also reports intermittent L flank pain x 2 days.

## 2021-04-14 ENCOUNTER — Telehealth (HOSPITAL_COMMUNITY): Payer: Self-pay | Admitting: Emergency Medicine

## 2021-04-14 LAB — CERVICOVAGINAL ANCILLARY ONLY
Bacterial Vaginitis (gardnerella): POSITIVE — AB
Candida Glabrata: NEGATIVE
Candida Vaginitis: POSITIVE — AB
Chlamydia: NEGATIVE
Comment: NEGATIVE
Comment: NEGATIVE
Comment: NEGATIVE
Comment: NEGATIVE
Comment: NEGATIVE
Comment: NORMAL
Neisseria Gonorrhea: NEGATIVE
Trichomonas: NEGATIVE

## 2021-04-14 LAB — URINE CULTURE: Culture: NO GROWTH

## 2021-04-14 MED ORDER — CLOTRIMAZOLE 1 % VA CREA: 1.0000 | TOPICAL_CREAM | Freq: Every day | VAGINAL | 0 refills | Status: DC

## 2021-04-14 MED ORDER — METRONIDAZOLE 0.75 % VA GEL: 1.0000 | Freq: Every day | VAGINAL | 0 refills | Status: AC

## 2021-06-10 DIAGNOSIS — O3432 Maternal care for cervical incompetence, second trimester: Secondary | ICD-10-CM | POA: Insufficient documentation

## 2021-06-15 ENCOUNTER — Ambulatory Visit: Payer: Self-pay

## 2021-06-23 DIAGNOSIS — O3432 Maternal care for cervical incompetence, second trimester: Secondary | ICD-10-CM | POA: Insufficient documentation

## 2021-06-23 DIAGNOSIS — O34219 Maternal care for unspecified type scar from previous cesarean delivery: Secondary | ICD-10-CM | POA: Insufficient documentation

## 2021-08-20 DIAGNOSIS — O43122 Velamentous insertion of umbilical cord, second trimester: Secondary | ICD-10-CM | POA: Insufficient documentation

## 2021-09-04 ENCOUNTER — Other Ambulatory Visit: Payer: Self-pay

## 2021-09-04 ENCOUNTER — Ambulatory Visit
Admission: RE | Admit: 2021-09-04 | Discharge: 2021-09-04 | Disposition: A | Discharge status: Home / Self Care | Payer: Medicaid Other | Source: Ambulatory Visit | Attending: Urgent Care | Admitting: Urgent Care

## 2021-09-04 VITALS — BP 124/82 | HR 106 | Temp 98.5°F | Resp 18 | Ht 67.0 in | Wt 207.0 lb

## 2021-09-04 DIAGNOSIS — J029 Acute pharyngitis, unspecified: Secondary | ICD-10-CM | POA: Diagnosis present

## 2021-09-04 DIAGNOSIS — Z1152 Encounter for screening for COVID-19: Secondary | ICD-10-CM | POA: Diagnosis not present

## 2021-09-04 DIAGNOSIS — Z3A25 25 weeks gestation of pregnancy: Secondary | ICD-10-CM | POA: Diagnosis present

## 2021-09-04 DIAGNOSIS — R0982 Postnasal drip: Secondary | ICD-10-CM | POA: Insufficient documentation

## 2021-09-04 DIAGNOSIS — R070 Pain in throat: Secondary | ICD-10-CM | POA: Insufficient documentation

## 2021-09-04 LAB — POCT RAPID STREP A (OFFICE): Rapid Strep A Screen: NEGATIVE

## 2021-09-04 MED ORDER — PSEUDOEPHEDRINE HCL 30 MG PO TABS: 30.0000 mg | ORAL_TABLET | Freq: Three times a day (TID) | ORAL | 0 refills | Status: DC | PRN

## 2021-09-04 MED ORDER — CETIRIZINE HCL 10 MG PO TABS: 10.0000 mg | ORAL_TABLET | Freq: Every day | ORAL | 0 refills | Status: DC

## 2021-09-04 NOTE — ED Provider Notes (Signed)
?[facility] ? ? ?MRN: [id number] DOB: [birth year] ? ?Subjective:  ? ?Dana Simpson is a 31 y.o. female presenting for 2-day history of acute onset throat pain, coughing, body aches, emesis, chills.  Patient is [redacted] weeks pregnant.  Had exposure to strep with her daughter who tested +2 days ago.  No history of respiratory disorders.  No chest pain, shortness of breath or wheezing.  She is not a smoker anymore. ? ?No current facility-administered medications for this encounter. ? ?Current Outpatient Medications:  ?  acetaminophen (TYLENOL) 325 MG tablet, Take 650 mg by mouth every 6 (six) hours as needed for mild pain or headache., Disp: , Rfl:  ?  benzonatate (TESSALON) 100 MG capsule, Take 1 capsule (100 mg total) by mouth every 8 (eight) hours., Disp: 21 capsule, Rfl: 0 ?  cephALEXin (KEFLEX) 500 MG capsule, Take 1 capsule (500 mg total) by mouth 2 (two) times daily., Disp: 10 capsule, Rfl: 0 ?  clotrimazole (GYNE-LOTRIMIN) 1 % vaginal cream, Place 1 Applicatorful vaginally at bedtime., Disp: 45 g, Rfl: 0 ?  nystatin (MYCOSTATIN/NYSTOP) powder, Apply 1 application topically 3 (three) times daily., Disp: 15 g, Rfl: 0 ?  ondansetron (ZOFRAN-ODT) 4 MG disintegrating tablet, Take 1 tablet (4 mg total) by mouth every 8 (eight) hours as needed for nausea or vomiting., Disp: 15 tablet, Rfl: 0 ?  phenazopyridine (PYRIDIUM) 200 MG tablet, Take 1 tablet (200 mg total) by mouth 3 (three) times daily., Disp: 6 tablet, Rfl: 0 ?  Prenatal Vit-Fe Fumarate-FA (PRENATAL MULTIVITAMIN) TABS tablet, Take 1 tablet by mouth daily at 12 noon., Disp: , Rfl:   ? ?Allergies  ?Allergen Reactions  ? Lamictal [Lamotrigine] Rash  ?  Trudie Buckler syndrome ?  ? ? ?Past Medical History:  ?Diagnosis Date  ? Depression   ? Migraines   ? Spinal headache   ? after cerclage with second pregnancy  ?  ? ?Past Surgical History:  ?Procedure Laterality Date  ? ADENOIDECTOMY    ? CERCLAGE REMOVAL    ? CERVICAL CERCLAGE  07/02/15  ?  CESAREAN SECTION    ? 2 previous   ? TONSILLECTOMY    ? ? ?Family History  ?Problem Relation Age of Onset  ? Diabetes Father   ? Diabetes Paternal Aunt   ? Diabetes Paternal Grandmother   ? ? ?Social History  ? ?Tobacco Use  ? Smoking status: Former  ?  Packs/day: 0.50  ?  Types: Cigarettes  ?  Start date: 06/04/2012  ? Smokeless tobacco: Never  ?Vaping Use  ? Vaping Use: Never used  ?Substance Use Topics  ? Alcohol use: Not Currently  ? Drug use: No  ? ? ?ROS ? ? ?Objective:  ? ?Vitals: ?BP 124/82 (BP Location: Right Arm)   Pulse (!) 106   Temp 98.5 ?F (36.9 ?C) (Oral)   Resp 18   Ht 5\' 7"  (1.702 m)   Wt 207 lb (93.9 kg)   LMP 03/03/2021 (Exact Date)   SpO2 96%   BMI 32.42 kg/m?  ? ?Physical Exam ?Constitutional:   ?   General: She is not in acute distress. ?   Appearance: Normal appearance. She is well-developed. She is not ill-appearing, toxic-appearing or diaphoretic.  ?HENT:  ?   Head: Normocephalic and atraumatic.  ?   Nose: Nose normal.  ?   Mouth/Throat:  ?   Mouth: Mucous membranes are moist.  ?   Pharynx: No pharyngeal swelling, oropharyngeal exudate, posterior oropharyngeal erythema or uvula swelling.  ?  Tonsils: No tonsillar exudate or tonsillar abscesses. 0 on the right. 0 on the left.  ?   Comments: [place] postnasal drainage overlying pharynx. ?Eyes:  ?   General: No scleral icterus.    ?   Right eye: No discharge.     ?   Left eye: No discharge.  ?   Extraocular Movements: Extraocular movements intact.  ?Cardiovascular:  ?   Rate and Rhythm: Normal rate.  ?   Heart sounds: No murmur heard. ?  No friction rub. No gallop.  ?Pulmonary:  ?   Effort: Pulmonary effort is normal. No respiratory distress.  ?   Breath sounds: No stridor. No wheezing, rhonchi or rales.  ?Chest:  ?   Chest wall: No tenderness.  ?Skin: ?   General: Skin is warm and dry.  ?Neurological:  ?   General: No focal deficit present.  ?   Mental Status: She is alert and oriented to person, place, and time.  ?Psychiatric:      ?   Mood and Affect: Mood normal.     ?   Behavior: Behavior normal.  ? ? ?Results for orders placed or performed during the hospital encounter of 09/04/21 (from the past 24 hour(s))  ?POCT rapid strep A     Status: None  ? Collection Time: 09/04/21  9:23 AM  ?Result Value Ref Range  ? Rapid Strep A Screen Negative Negative  ? ? ?Assessment and Plan :  ? ?PDMP not reviewed this encounter. ? ?1. Post-nasal drainage   ?2. Encounter for screening for COVID-19   ?3. Throat pain   ?4. [redacted] weeks gestation of pregnancy   ?5. Acute pharyngitis, unspecified etiology   ? ?COVID, flu test pending, strep culture also pending.  Recommended supportive care for an acute viral syndrome, acute pharyngitis, viral URI.  I did prescribe over-the-counter medications that are safe in pregnancy but encouraged her to recheck with her OB. Counseled patient on potential for adverse effects with medications prescribed/recommended today, ER and return-to-clinic precautions discussed, patient verbalized understanding. ? ?  ?Wallis Bamberg, PA-C ?09/04/21 9417 ? ?

## 2021-09-04 NOTE — ED Triage Notes (Signed)
Pt reports daughter was diagnosed with strep this week and reports started having sore throat x2 days, body aches, emesis, chills.  ? ?Pt reports is also [redacted] weeks pregnant. ?

## 2021-09-05 LAB — COVID-19, FLU A+B NAA
Influenza A, NAA: NOT DETECTED
Influenza B, NAA: NOT DETECTED
SARS-CoV-2, NAA: NOT DETECTED

## 2021-09-07 LAB — CULTURE, GROUP A STREP (THRC)

## 2022-08-23 ENCOUNTER — Ambulatory Visit
Admission: RE | Admit: 2022-08-23 | Discharge: 2022-08-23 | Disposition: A | Discharge status: Home / Self Care | Payer: Medicaid Other | Source: Ambulatory Visit | Attending: Nurse Practitioner | Admitting: Nurse Practitioner

## 2022-08-23 VITALS — BP 127/80 | HR 61 | Temp 97.2°F | Resp 20

## 2022-08-23 DIAGNOSIS — R300 Dysuria: Secondary | ICD-10-CM | POA: Diagnosis present

## 2022-08-23 DIAGNOSIS — N760 Acute vaginitis: Secondary | ICD-10-CM | POA: Diagnosis present

## 2022-08-23 LAB — POCT URINALYSIS DIP (MANUAL ENTRY)
Bilirubin, UA: NEGATIVE
Blood, UA: NEGATIVE
Glucose, UA: NEGATIVE mg/dL
Ketones, POC UA: NEGATIVE mg/dL
Leukocytes, UA: NEGATIVE
Nitrite, UA: NEGATIVE
Protein Ur, POC: NEGATIVE mg/dL
Spec Grav, UA: 1.01 (ref 1.010–1.025)
Urobilinogen, UA: 0.2 E.U./dL
pH, UA: 5.5 (ref 5.0–8.0)

## 2022-08-23 MED ORDER — PHENAZOPYRIDINE HCL 100 MG PO TABS: 100.0000 mg | ORAL_TABLET | Freq: Three times a day (TID) | ORAL | 0 refills | Status: AC | PRN

## 2022-08-23 NOTE — ED Provider Notes (Signed)
[facility]    CSN: [id number] Arrival date & time: 08/23/22  [id number]      History   Chief Complaint Chief Complaint  Patient presents with   Urinary Tract Infection    HPI Dana Simpson is a 32 y.o. female.   Patient presents today for 1 week history of dysuria, urinary frequency and urgency, lower abdominal pain, new low back pain, and creamy/itchy vaginal discharge.  She denies new urinary incontinence, foul urinary odor, hematuria, fever, vomiting, and decreased appetite or decreased oral intake.  Patient reports she has Nexplanon, no new sexual partner and is not concerned for STD today.  No suprapubic or pelvic pain.  No vaginal sores, rashes, or swelling in the groin.  Has not taken anything for symptoms so far.    Past Medical History:  Diagnosis Date   Depression    Migraines    Spinal headache    after cerclage with second pregnancy    Patient Active Problem List   Diagnosis Date Noted   Nausea with vomiting 08/23/2013   Acute pharyngitis 08/23/2013   Unspecified episodic mood disorder 07/23/2013   GERD (gastroesophageal reflux disease) 06/11/2013   Obesity, unspecified 06/11/2013   Depression 10/02/2012   Eczema 08/11/2012   History of drug dependence ([place]) 05/22/2012   Anxiety 05/22/2012   Migraine headache 05/22/2012   History of suicide attempt 05/22/2012   TOBACCO ABUSE 01/15/2008    Past Surgical History:  Procedure Laterality Date   ADENOIDECTOMY     CERCLAGE REMOVAL     CERVICAL CERCLAGE  07/02/15   CESAREAN SECTION     2 previous    TONSILLECTOMY      OB History     Gravida  4   Para  3   Term  2   Preterm  1   AB  0   Living  2      SAB  0   IAB  0   Ectopic  0   Multiple  0   Live Births  2            Home Medications    Prior to Admission medications   Medication Sig Start Date End Date Taking? Authorizing Provider  etonogestrel (NEXPLANON) 68 MG IMPL implant  12/23/21  Yes [provider]  pantoprazole (PROTONIX) 20 MG tablet Take by mouth daily. 06/24/22  Yes [provider]  phenazopyridine (PYRIDIUM) 100 MG tablet Take 1 tablet (100 mg total) by mouth 3 (three) times daily as needed for up to 3 days for pain. 08/23/22 08/26/22 Yes Eulogio Bear, NP  acetaminophen (TYLENOL) 325 MG tablet Take 650 mg by mouth every 6 (six) hours as needed for mild pain or headache.    [provider]  FLUoxetine (PROZAC) 40 MG capsule Take 40 mg by mouth daily.    [provider]  LORazepam (ATIVAN) 1 MG tablet Take 0.5 mg by mouth 2 (two) times daily.    [provider]    Family History Family History  Problem Relation Age of Onset   Diabetes Father    Diabetes Paternal Aunt    Diabetes Paternal Grandmother     Social History Social History   Tobacco Use   Smoking status: Former    Packs/day: 0.50    Types: Cigarettes    Start date: 06/04/2012   Smokeless tobacco: Never  Vaping Use   Vaping Use: Never used  Substance Use Topics   Alcohol use: Not  Currently   Drug use: No     Allergies   Lamictal [lamotrigine]   Review of Systems Review of Systems Per HPI  Physical Exam Triage Vital Signs ED Triage Vitals  Enc Vitals Group     BP 08/23/22 1004 127/80     Pulse Rate 08/23/22 1004 61     Resp 08/23/22 1004 20     Temp 08/23/22 1004 (!) 97.2 F (36.2 C)     Temp Source 08/23/22 1004 Oral     SpO2 08/23/22 1004 98 %     Weight --      Height --      Head Circumference --      Peak Flow --      Pain Score 08/23/22 1009 7     Pain Loc --      Pain Edu? --      Excl. in [place]? --    No data found.  Updated Vital Signs BP 127/80 (BP Location: Right Arm)   Pulse 61   Temp (!) 97.2 F (36.2 C) (Oral)   Resp 20   LMP 07/23/2022   SpO2 98%   Breastfeeding No   Visual Acuity Right Eye Distance:   Left Eye Distance:   Bilateral Distance:    Right Eye Near:   Left Eye Near:    Bilateral Near:      Physical Exam Vitals and nursing note reviewed.  Constitutional:      General: She is not in acute distress.    Appearance: She is not toxic-appearing.  HENT:     Mouth/Throat:     Mouth: Mucous membranes are moist.     Pharynx: Oropharynx is clear.  Cardiovascular:     Rate and Rhythm: Normal rate and regular rhythm.  Pulmonary:     Effort: Pulmonary effort is normal. No respiratory distress.  Abdominal:     General: Abdomen is flat. Bowel sounds are normal. There is no distension.     Palpations: Abdomen is soft. There is no mass.     Tenderness: There is no abdominal tenderness. There is no right CVA tenderness, left CVA tenderness or guarding. Negative signs include Murphy's sign, Rovsing's sign and McBurney's sign.  Skin:    General: Skin is warm and dry.     Coloration: Skin is not jaundiced or pale.     Findings: No erythema.  Neurological:     Mental Status: She is alert and oriented to person, place, and time.     Motor: No weakness.     Gait: Gait normal.  Psychiatric:        Behavior: Behavior is cooperative.      UC Treatments / Results  Labs (all labs ordered are listed, but only abnormal results are displayed) Labs Reviewed  POCT URINALYSIS DIP (MANUAL ENTRY) - Abnormal; Notable for the following components:      Result Value   Color, UA light yellow (*)    All other components within normal limits  URINE CULTURE  CERVICOVAGINAL ANCILLARY ONLY    EKG   Radiology No results found.  Procedures Procedures (including critical care time)  Medications Ordered in UC Medications - No data to display  Initial Impression / Assessment and Plan / UC Course  I have reviewed the triage vital signs and the nursing notes.  Pertinent labs & imaging results that were available during my care of the patient were reviewed by me and considered in my medical decision making (see chart for  details).   Patient is well-appearing, normotensive, afebrile, not  tachycardic, not tachypneic, oxygenating well on room air.    1. Dysuria 2. Acute vaginitis Examination and vital signs today are reassuring Suspect dysuria secondary to acute vaginitis; suspect yeast vaginitis versus bacterial vaginosis Self swab is pending-will need to treat as indicated Urinalysis today is reassuring Start Pyridium for symptom management Urine culture sent per patient request Patient not concerned about STDs today and declines STD testing including HIV and syphilis  The patient was given the opportunity to ask questions.  All questions answered to their satisfaction.  The patient is in agreement to this plan.   Final Clinical Impressions(s) / UC Diagnoses   Final diagnoses:  Dysuria  Acute vaginitis     Discharge Instructions      The urinalysis today looks great - we are sending for culture to check for a UTI.  We will let you know about the vaginal swab and urine culture results in the next 1-2 days and will prescribe treatment if needed.    In the meantime, continue drinking plenty of water and start Pyridium to help with the bladder symptoms.     ED Prescriptions     Medication Sig Dispense Auth. Provider   phenazopyridine (PYRIDIUM) 100 MG tablet Take 1 tablet (100 mg total) by mouth 3 (three) times daily as needed for up to 3 days for pain. 9 tablet Eulogio Bear, NP      PDMP not reviewed this encounter.   Eulogio Bear, NP 08/23/22 1051

## 2022-08-23 NOTE — Discharge Instructions (Addendum)
The urinalysis today looks great - we are sending for culture to check for a UTI.  We will let you know about the vaginal swab and urine culture results in the next 1-2 days and will prescribe treatment if needed.    In the meantime, continue drinking plenty of water and start Pyridium to help with the bladder symptoms.

## 2022-08-23 NOTE — ED Triage Notes (Addendum)
Pt reports she has a itchy burning vagina when urinating and during intercourse, low abdominal pain, low back, pain, discharge - but no odor, frequent urination x 1 week.   Pt denies any change in partners.

## 2022-08-24 ENCOUNTER — Telehealth (HOSPITAL_COMMUNITY): Payer: Self-pay | Admitting: Emergency Medicine

## 2022-08-24 LAB — URINE CULTURE: Culture: NO GROWTH

## 2022-08-24 LAB — CERVICOVAGINAL ANCILLARY ONLY
Bacterial Vaginitis (gardnerella): NEGATIVE
Candida Glabrata: NEGATIVE
Candida Vaginitis: POSITIVE — AB
Comment: NEGATIVE
Comment: NEGATIVE
Comment: NEGATIVE

## 2022-08-24 MED ORDER — FLUCONAZOLE 150 MG PO TABS: 150.0000 mg | ORAL_TABLET | Freq: Once | ORAL | 0 refills | Status: AC

## 2022-11-15 ENCOUNTER — Ambulatory Visit: Payer: Medicaid Other | Admitting: Family Medicine

## 2022-12-08 ENCOUNTER — Ambulatory Visit: Payer: Medicaid Other | Admitting: Family Medicine

## 2023-01-04 ENCOUNTER — Ambulatory Visit: Payer: Medicaid Other | Admitting: Family Medicine

## 2023-01-18 ENCOUNTER — Encounter: Payer: Self-pay | Admitting: Family Medicine

## 2023-01-18 ENCOUNTER — Ambulatory Visit (INDEPENDENT_AMBULATORY_CARE_PROVIDER_SITE_OTHER): Payer: Medicaid Other | Admitting: Family Medicine

## 2023-01-18 VITALS — BP 120/78 | HR 91 | Temp 97.7°F | Ht 67.0 in | Wt 195.0 lb

## 2023-01-18 DIAGNOSIS — M79671 Pain in right foot: Secondary | ICD-10-CM

## 2023-01-18 DIAGNOSIS — E669 Obesity, unspecified: Secondary | ICD-10-CM

## 2023-01-18 DIAGNOSIS — Z7689 Persons encountering health services in other specified circumstances: Secondary | ICD-10-CM | POA: Insufficient documentation

## 2023-01-18 NOTE — Patient Instructions (Signed)
 It was great to meet you today and I'm excited to have you join the [facility] practice. I hope you had a positive experience today! If you feel so inclined, please feel free to recommend our practice to friends and family. Mila Merry, FNP-C

## 2023-01-18 NOTE — Progress Notes (Signed)
New Patient Office Visit  Subjective    Patient ID: Dana Simpson, female    DOB: 09/22/1990  Age: 32 y.o. MRN: [id number]  CC:  Chief Complaint  Patient presents with   Establish Care    HPI SMT LOKEY presents to establish care. Oriented to practice routines and expectations. She has not had routine care from a PCP but has been seeing OB during her pregnancies. PMH includes depression and anxiety. Her OB is currently managing her Prozac and Phentermine. Current concerns include difficulty falling asleep. Has tried OTC sleep aids. She reports good sleep hygiene. She defers physical with labs to her OB.  Cervical CA screening:  due, will have with OB Tobacco: non-smoker Vaccines:  UTD    Outpatient Encounter Medications as of 01/18/2023  Medication Sig   acetaminophen (TYLENOL) 325 MG tablet Take 650 mg by mouth every 6 (six) hours as needed for mild pain or headache.   etonogestrel (NEXPLANON) 68 MG IMPL implant    FLUoxetine (PROZAC) 40 MG capsule Take 40 mg by mouth daily.   LORazepam (ATIVAN) 1 MG tablet Take 0.5 mg by mouth 2 (two) times daily.   pantoprazole (PROTONIX) 20 MG tablet Take by mouth daily.   phentermine (ADIPEX-P) 37.5 MG tablet Take 37.5 mg by mouth.   [DISCONTINUED] amLODipine (NORVASC) 5 MG tablet Take 5 mg by mouth.   No facility-administered encounter medications on file as of 01/18/2023.    Past Medical History:  Diagnosis Date   Depression    Migraines    Spinal headache    after cerclage with second pregnancy    Past Surgical History:  Procedure Laterality Date   ADENOIDECTOMY     CERCLAGE REMOVAL     CERVICAL CERCLAGE  07/02/15   CESAREAN SECTION     2 previous    TONSILLECTOMY      Family History  Problem Relation Age of Onset   Diabetes Father    Diabetes Paternal Aunt    Diabetes Paternal Grandmother     Social History   Socioeconomic History   Marital status: Legally Separated    Spouse name: Not on file    Number of children: 2   Years of education: Not on file   Highest education level: GED or equivalent  Occupational History   Not on file  Tobacco Use   Smoking status: Former    Current packs/day: 0.50    Average packs/day: 0.5 packs/day for 10.6 years (5.3 ttl pk-yrs)    Types: Cigarettes    Start date: 06/04/2012   Smokeless tobacco: Never  Vaping Use   Vaping status: Never Used  Substance and Sexual Activity   Alcohol use: Not Currently   Drug use: No   Sexual activity: Yes  Other Topics Concern   Not on file  Social History Narrative   Not on file   Social Determinants of Health   Financial Resource Strain: Patient Declined (09/17/2021)   Received from [facility], [facility]   Overall Financial Resource Strain (CARDIA)    Difficulty of Paying Living Expenses: Patient declined  Food Insecurity: Patient Declined (09/17/2021)   Received from [facility], [facility]   Hunger Vital Sign    Worried About Running Out of Food in the Last Year: Patient declined    Ran Out of Food in the Last Year: Patient declined  Transportation Needs: Patient Declined (09/17/2021)   Received from [facility], [facility]  PRAPARE - Administrator, Civil Service (Medical): Patient declined    Lack of Transportation (Non-Medical): Patient declined  Physical Activity: Patient Declined (09/17/2021)   Received from [facility], [facility]   Exercise Vital Sign    Days of Exercise per Week: Patient declined    Minutes of Exercise per Session: Patient declined  Stress: Patient Declined (09/17/2021)   Received from [facility], [facility] Occupational Health - Occupational Stress Questionnaire    Feeling of Stress : Patient declined  Social Connections: Patient Declined (09/17/2021)   Received from [facility], [facility]   Social Connection and Isolation Panel [NHANES]    Frequency of Communication with Friends  and Family: Patient declined    Frequency of Social Gatherings with Friends and Family: Patient declined    Attends Religious Services: Patient declined    Database administrator or Organizations: Patient declined    Attends Banker Meetings: Patient declined    Marital Status: Patient declined  Intimate Partner Violence: Not At Risk (01/12/2022)   Received from [facility], [facility]   Humiliation, Afraid, Rape, and Kick questionnaire    Fear of Current or Ex-Partner: No    Emotionally Abused: No    Physically Abused: No    Sexually Abused: No    Review of Systems  All other systems reviewed and are negative.       Objective    BP 120/78   Pulse 91   Temp 97.7 F (36.5 C) (Oral)   Ht 5\' 7"  (1.702 m)   Wt 195 lb (88.5 kg)   SpO2 98%   BMI 30.54 kg/m   Physical Exam Vitals and nursing note reviewed.  Constitutional:      Appearance: Normal appearance. She is normal weight.  HENT:     Head: Normocephalic and atraumatic.  Cardiovascular:     Rate and Rhythm: Normal rate and regular rhythm.     Pulses: Normal pulses.     Heart sounds: Normal heart sounds.  Pulmonary:     Effort: Pulmonary effort is normal.     Breath sounds: Normal breath sounds.  Skin:    General: Skin is warm and dry.  Neurological:     General: No focal deficit present.     Mental Status: She is alert and oriented to person, place, and time. Mental status is at baseline.  Psychiatric:        Mood and Affect: Mood normal.        Behavior: Behavior normal.        Thought Content: Thought content normal.        Judgment: Judgment normal.         Assessment & Plan:   Problem List Items Addressed This Visit     Encounter to establish care with new doctor - Primary    Today your medical history was reviewed and current concerns. Recommend 150 minutes of moderate intensity exercise weekly and consuming a well-balanced diet. Advised to stop smoking if a smoker, avoid  smoking if a non-smoker, limit alcohol consumption to 1 drink per day for women and 2 drinks per day for men, and avoid illicit drug use. Counseled in mental health awareness and when to seek medical care. Vaccine maintenance discussed. Appropriate health maintenance items reviewed. Return to office in 1 year for annual physical exam.       Obesity (BMI 30.0-34.9)  Recommend low calorie diet 1200-1500 calories daily and 150 minutes of moderate intensity exercise weekly. Continue phentermine per OB.      Relevant Orders   CBC with Differential/Platelet   COMPLETE METABOLIC PANEL WITH GFR   Lipid panel   TSH   Right foot pain    Chronic. No trauma or deformity. Advised to wear well fitting shoes. May take Ibuprofen for pain management. Will refer to podiatry for further evaluation at patients request.      Relevant Orders   Ambulatory referral to Podiatry    Return in about 1 year (around 01/18/2024) for annual physical with labs 1 week prior.   Park Meo, FNP

## 2023-01-18 NOTE — Assessment & Plan Note (Signed)
Today your medical history was reviewed and current concerns. Recommend 150 minutes of moderate intensity exercise weekly and consuming a well-balanced diet. Advised to stop smoking if a smoker, avoid smoking if a non-smoker, limit alcohol consumption to 1 drink per day for women and 2 drinks per day for men, and avoid illicit drug use. Counseled in mental health awareness and when to seek medical care. Vaccine maintenance discussed. Appropriate health maintenance items reviewed. Return to office in 1 year for annual physical exam.

## 2023-01-18 NOTE — Assessment & Plan Note (Signed)
Recommend low calorie diet 1200-1500 calories daily and 150 minutes of moderate intensity exercise weekly. Continue phentermine per OB.

## 2023-01-18 NOTE — Assessment & Plan Note (Signed)
Chronic. No trauma or deformity. Advised to wear well fitting shoes. May take Ibuprofen for pain management. Will refer to podiatry for further evaluation at patients request.

## 2023-02-07 ENCOUNTER — Ambulatory Visit: Payer: Medicaid Other | Admitting: Podiatry

## 2023-02-16 ENCOUNTER — Ambulatory Visit (INDEPENDENT_AMBULATORY_CARE_PROVIDER_SITE_OTHER): Payer: Medicaid Other | Admitting: Podiatry

## 2023-02-16 DIAGNOSIS — Z91199 Patient's noncompliance with other medical treatment and regimen due to unspecified reason: Secondary | ICD-10-CM

## 2023-02-16 NOTE — Progress Notes (Signed)
No show

## 2023-04-04 ENCOUNTER — Ambulatory Visit: Payer: Self-pay

## 2023-07-24 ENCOUNTER — Emergency Department (HOSPITAL_COMMUNITY)
Admission: EM | Admit: 2023-07-24 | Discharge: 2023-07-24 | Disposition: A | Discharge status: Home / Self Care | Payer: Medicaid Other | Attending: Emergency Medicine | Admitting: Emergency Medicine

## 2023-07-24 ENCOUNTER — Encounter (HOSPITAL_COMMUNITY): Payer: Self-pay | Admitting: Emergency Medicine

## 2023-07-24 DIAGNOSIS — Z760 Encounter for issue of repeat prescription: Secondary | ICD-10-CM | POA: Insufficient documentation

## 2023-07-24 DIAGNOSIS — H5712 Ocular pain, left eye: Secondary | ICD-10-CM | POA: Diagnosis present

## 2023-07-24 DIAGNOSIS — H1032 Unspecified acute conjunctivitis, left eye: Secondary | ICD-10-CM | POA: Insufficient documentation

## 2023-07-24 MED ORDER — FLUORESCEIN SODIUM 1 MG OP STRP
1.0000 | ORAL_STRIP | Freq: Once | OPHTHALMIC | Status: AC
  Administered 2023-07-24: 1 via OPHTHALMIC
  Filled 2023-07-24: qty 1

## 2023-07-24 MED ORDER — POLYMYXIN B-TRIMETHOPRIM 10000-0.1 UNIT/ML-% OP SOLN
1.0000 [drp] | OPHTHALMIC | Status: DC
  Administered 2023-07-24: 1 [drp] via OPHTHALMIC
  Filled 2023-07-24: qty 10

## 2023-07-24 MED ORDER — TETRACAINE HCL 0.5 % OP SOLN
1.0000 [drp] | Freq: Once | OPHTHALMIC | Status: AC
  Administered 2023-07-24: 1 [drp] via OPHTHALMIC
  Filled 2023-07-24: qty 4

## 2023-07-24 MED ORDER — ESCITALOPRAM OXALATE 10 MG PO TABS: 10.0000 mg | ORAL_TABLET | Freq: Every day | ORAL | 1 refills | Status: DC

## 2023-07-24 NOTE — ED Triage Notes (Signed)
Pt in with L eye pain, swelling and itching x 3 days. Pt states she had not changed her contacts for awhile and changed them but symptoms persist. Pt also anxious and tearful during triage, states she has been out of PTSD meds x 4 days

## 2023-07-24 NOTE — Discharge Instructions (Addendum)
You were seen today for left eye pain.  Your exam is consistent with conjunctivitis.  Use antibiotic drops.  Put 1 drop in your left eye every 4 hours.  Do not wear contact lenses until you are 100% improved.  Continue wear glasses.  Follow-up with ophthalmology.  You will be given a refill of your Lexapro.  See resources provided for ongoing mental health services.

## 2023-07-24 NOTE — ED Provider Notes (Signed)
[place] [facility] AT [facility] Provider Note   CSN: [id number] Arrival date & time: 07/24/23  6440     History  Chief Complaint  Patient presents with   Eye Pain    Dana Simpson is a 33 y.o. female.  HPI     This is a 33 year old female who presents with left eye pain and redness.  Patient reports onset of symptoms approximately 24 hours ago.  She states that she had some old contacts and because she could not find her glasses.  She has not had any vision changes but reports pain, light sensitivity and redness of the left eye.  Has noted some clear drainage.  No other upper respiratory symptoms.  Does not have a foreign body sensation.  Denies injury.  Of note, patient also is concerned that she has not been able to get her "mental health" medications refilled.  She states specifically she has run out of her Lexapro.  She states that she has been very tense and upset since that time.  She recently was at [facility].  Denies SI.  Home Medications Prior to Admission medications   Medication Sig Start Date End Date Taking? Authorizing Provider  escitalopram (LEXAPRO) 10 MG tablet Take 1 tablet (10 mg total) by mouth daily. 07/24/23  Yes Kiani Wurtzel, Mayer Masker, MD  acetaminophen (TYLENOL) 325 MG tablet Take 650 mg by mouth every 6 (six) hours as needed for mild pain or headache.    [provider]  etonogestrel (NEXPLANON) 68 MG IMPL implant  12/23/21   [provider]  FLUoxetine (PROZAC) 40 MG capsule Take 40 mg by mouth daily.    [provider]  LORazepam (ATIVAN) 1 MG tablet Take 0.5 mg by mouth 2 (two) times daily.    [provider]  pantoprazole (PROTONIX) 20 MG tablet Take by mouth daily. 06/24/22   [provider]  phentermine (ADIPEX-P) 37.5 MG tablet Take 37.5 mg by mouth. 01/17/23   [provider]      Allergies    Lamotrigine    Review of Systems   Review of Systems  Eyes:  Positive for  photophobia, pain, discharge and redness. Negative for itching and visual disturbance.  All other systems reviewed and are negative.   Physical Exam Updated Vital Signs BP (!) 138/109   Pulse (!) 108   Temp 98.3 F (36.8 C) (Oral)   Resp (!) 22   Wt 88.5 kg   LMP  (LMP Unknown)   SpO2 98%   BMI 30.56 kg/m  Physical Exam Vitals and nursing note reviewed.  Constitutional:      Appearance: She is well-developed.     Comments: Anxious appearing but nontoxic  HENT:     Head: Normocephalic and atraumatic.  Eyes:     General: Lids are normal. Lids are everted, no foreign bodies appreciated. Vision grossly intact.     Extraocular Movements: Extraocular movements intact.     Pupils: Pupils are equal, round, and reactive to light.     Comments: Injected conjunctiva on the left, no fluorescein uptake, no obvious foreign body or corneal abrasion, no corneal ulcer noted  Cardiovascular:     Rate and Rhythm: Normal rate and regular rhythm.  Pulmonary:     Effort: Pulmonary effort is normal. No respiratory distress.  Abdominal:     Palpations: Abdomen is soft.  Musculoskeletal:     Cervical back: Neck supple.  Skin:    General: Skin is warm  and dry.  Neurological:     Mental Status: She is alert and oriented to person, place, and time.  Psychiatric:     Comments: Anxious, labile     ED Results / Procedures / Treatments   Labs (all labs ordered are listed, but only abnormal results are displayed) Labs Reviewed - No data to display  EKG None  Radiology No results found.  Procedures Procedures    Medications Ordered in ED Medications  trimethoprim-polymyxin b (POLYTRIM) ophthalmic solution 1 drop (1 drop Left Eye Given 07/24/23 0554)  tetracaine (PONTOCAINE) 0.5 % ophthalmic solution 1 drop (1 drop Both Eyes Given 07/24/23 0554)  fluorescein ophthalmic strip 1 strip (1 strip Left Eye Given 07/24/23 0554)    ED Course/ Medical Decision Making/ A&P                                  Medical Decision Making Risk Prescription drug management.   This patient presents to the ED for concern of eye pain and redness, this involves an extensive number of treatment options, and is a complaint that carries with it a high risk of complications and morbidity.  I considered the following differential and admission for this acute, potentially life threatening condition.  The differential diagnosis includes conjunctivitis, corneal abrasion, iritis, foreign body, abrasion  MDM:    This is a 33 year old female who presents with left eye pain and redness.  She is nontoxic.  She is very anxious appearing.  Reports being out of her Lexapro which is giving her high anxiety.  Denies upper respiratory symptoms.  Clinical presentation is most consistent with conjunctivitis.  No evidence of corneal abrasion or corneal ulcer on exam.  Pupils are equal and reactive.  Doubt iritis.  No obvious foreign body.  Patient's visual acuity is grossly intact head visual acuity is reassuring.  Patient was given Polytrim drops.  Discussed with her that she needs to wear her glasses until completely resolved.  Also encouraged ophthalmology follow-up.  She was given a prescription for her Lexapro with 1 refill.  She reports challenges with getting primary physician to prescribe her ongoing medications.  She was given resources given her mental health history.  (Labs, imaging, consults)  Labs: I Ordered, and personally interpreted labs.  The pertinent results include: None  Imaging Studies ordered: I ordered imaging studies including none I independently visualized and interpreted imaging. I agree with the radiologist interpretation  Additional history obtained from chart review.  External records from outside source obtained and reviewed including prior evaluations  Cardiac Monitoring: The patient was not maintained on a cardiac monitor.  If on the cardiac monitor, I personally viewed and interpreted  the cardiac monitored which showed an underlying rhythm of: N/A  Reevaluation: After the interventions noted above, I reevaluated the patient and found that they have :stayed the same  Social Determinants of Health:  lives independently  Disposition: Discharge  Co morbidities that complicate the patient evaluation  Past Medical History:  Diagnosis Date   Depression    Migraines    Spinal headache    after cerclage with second pregnancy     Medicines Meds ordered this encounter  Medications   tetracaine (PONTOCAINE) 0.5 % ophthalmic solution 1 drop   fluorescein ophthalmic strip 1 strip   trimethoprim-polymyxin b (POLYTRIM) ophthalmic solution 1 drop   escitalopram (LEXAPRO) 10 MG tablet    Sig: Take 1 tablet (10 mg total)  by mouth daily.    Dispense:  30 tablet    Refill:  1    I have reviewed the patients home medicines and have made adjustments as needed  Problem List / ED Course: Problem List Items Addressed This Visit   None Visit Diagnoses       Acute conjunctivitis of left eye, unspecified acute conjunctivitis type    -  Primary     Encounter for medication refill                       Final Clinical Impression(s) / ED Diagnoses Final diagnoses:  Acute conjunctivitis of left eye, unspecified acute conjunctivitis type  Encounter for medication refill    Rx / DC Orders ED Discharge Orders          Ordered    escitalopram (LEXAPRO) 10 MG tablet  Daily        07/24/23 0556              Shon Baton, MD 07/24/23 [phone]

## 2024-04-02 ENCOUNTER — Ambulatory Visit (HOSPITAL_BASED_OUTPATIENT_CLINIC_OR_DEPARTMENT_OTHER): Payer: MEDICAID

## 2024-04-10 ENCOUNTER — Ambulatory Visit (INDEPENDENT_AMBULATORY_CARE_PROVIDER_SITE_OTHER): Payer: MEDICAID

## 2024-04-10 ENCOUNTER — Encounter (HOSPITAL_BASED_OUTPATIENT_CLINIC_OR_DEPARTMENT_OTHER): Payer: Self-pay

## 2024-04-10 VITALS — BP 111/75 | HR 93 | Ht 67.0 in | Wt 230.0 lb

## 2024-04-10 DIAGNOSIS — G4719 Other hypersomnia: Secondary | ICD-10-CM | POA: Diagnosis not present

## 2024-04-10 NOTE — Progress Notes (Signed)
 @Patient  ID: Dana Simpson, female    DOB: [birth year], 33 y.o.   MRN: [id number]  Chief Complaint  Patient presents with   Establish Care    New sleep     Referring provider: Teresa Jenkins Jansky, FNP  HPI: Dana LITTIE. [place] is a 33 y/o female with PMH of anxiety/depression and migraines who presents as a new patient for evaluation of sleep.  She reports that she goes to bed between 8 and 10 PM every night and wakes up every morning between 530 and 6:30 AM.  Takes about half an hour to fall asleep and she wakes up a couple times in the night.  She reports that her weight has increased about 30 pounds over the last 2 years and she feels that her symptoms have been worse since then.  She complains of snoring, excessive daytime sleepiness, drowsiness while driving, and paroxysmal nocturnal dyspnea.  She denies chest pain, palpitations, fever, chills, night sweats, irregular heartbeats, hemoptysis.  She reports that she had a 5-pack-year smoking history but quit 3 years ago.  She also quit drinking alcohol 3 years ago.  She reports that she uses marijuana daily both for recreational and anxiety needs.  She admits to drinking coffee in the mornings and several sodas in the afternoon.  She reports that both her mother and father have been diagnosed with sleep apnea; she is unsure if they are treated.  Review of system is positive for shortness of breath with activity which does not limit her activities.  Is also positive for anxiety and depression but otherwise negative.  TEST/EVENTS : Epworth score of 13.  Allergies  Allergen Reactions   Lamotrigine Rash and Anaphylaxis    Elspeth louder syndrome    Immunization History  Administered Date(s) Administered   H1N1 05/01/2008   Influenza Whole 04/08/2008   Influenza, Seasonal, Injecte, Preservative Fre 07/07/2012   Influenza,inj,Quad PF,6+ Mos 06/11/2013   Tdap 01/10/2012, 01/03/2016, 09/17/2021    Past Medical History:  Diagnosis Date    Depression    Migraines    Spinal headache    after cerclage with second pregnancy    Tobacco History: Social History   Tobacco Use  Smoking Status Former   Current packs/day: 0.50   Average packs/day: 0.5 packs/day for 11.8 years (5.9 ttl pk-yrs)   Types: Cigarettes   Start date: 06/04/2012  Smokeless Tobacco Never   Counseling given: Not Answered   Outpatient Medications Prior to Visit  Medication Sig Dispense Refill   acetaminophen  (TYLENOL ) 325 MG tablet Take 650 mg by mouth every 6 (six) hours as needed for mild pain or headache.     etonogestrel  (NEXPLANON ) 68 MG IMPL implant      FLUoxetine (PROZAC) 40 MG capsule Take 40 mg by mouth daily.     LORazepam (ATIVAN) 1 MG tablet Take 0.5 mg by mouth 2 (two) times daily.     pantoprazole (PROTONIX) 20 MG tablet Take by mouth daily.     phentermine (ADIPEX-P) 37.5 MG tablet Take 37.5 mg by mouth.     buPROPion (WELLBUTRIN XL) 150 MG 24 hr tablet Take 150 mg by mouth every morning.     escitalopram  (LEXAPRO ) 10 MG tablet Take 1 tablet (10 mg total) by mouth daily. (Patient not taking: Reported on 04/10/2024) 30 tablet 1   No facility-administered medications prior to visit.     Review of Systems: Positive as mentioned in HPI  Constitutional:   No  weight loss, night sweats,  Fevers,  chills, fatigue, or  lassitude.  HEENT:   No headaches,  Difficulty swallowing,  Tooth/dental problems, or  Sore throat,                No sneezing, itching, ear ache, nasal congestion, post nasal drip,   CV:  No chest pain,  Orthopnea, PND, swelling in lower extremities, anasarca, dizziness, palpitations, syncope.   GI  No heartburn, indigestion, abdominal pain, nausea, vomiting, diarrhea, change in bowel habits, loss of appetite, bloody stools.   Resp: No shortness of breath with exertion or at rest.  No excess mucus, no productive cough,  No non-productive cough,  No coughing up of blood.  No change in color of mucus.  No wheezing.  No chest  wall deformity  Skin: no rash or lesions.  GU: no dysuria, change in color of urine, no urgency or frequency.  No flank pain, no hematuria   MS:  No joint pain or swelling.  No decreased range of motion.  No back pain.    Physical Exam  BP 111/75   Pulse 93   Ht 5' 7 (1.702 m)   Wt 230 lb (104.3 kg)   SpO2 96%   BMI 36.02 kg/m   GEN: A/Ox3; pleasant , NAD, well nourished    HEENT:  [place]/AT,  EACs-clear, TMs-wnl, NOSE-clear, THROAT-clear, no lesions, no postnasal drip or exudate noted.  Mallampati 3  NECK:  Supple w/ fair ROM; no JVD; normal carotid impulses w/o bruits; no thyromegaly or nodules palpated; no lymphadenopathy.    RESP  Clear  P & A; w/o, wheezes/ rales/ or rhonchi. no accessory muscle use, no dullness to percussion  CARD:  RRR, no m/r/g, no peripheral edema, pulses intact, no cyanosis or clubbing.  GI:   Soft & nt; nml bowel sounds; no organomegaly or masses detected.   Musco: Warm bil, no deformities or joint swelling noted.   Neuro: alert, no focal deficits noted.    Skin: Warm, no lesions or rashes    Lab Results:  CBC    Component Value Date/Time   WBC 15.7 (H) 11/13/2015 1733   RBC 3.96 11/13/2015 1733   HGB 11.7 (L) 11/13/2015 1733   HCT 34.1 (L) 11/13/2015 1733   PLT 273 11/13/2015 1733   MCV 86.1 11/13/2015 1733   MCH 29.5 11/13/2015 1733   MCHC 34.3 11/13/2015 1733   RDW 13.9 11/13/2015 1733   LYMPHSABS 1.2 10/27/2012 0927   MONOABS 0.9 10/27/2012 0927   EOSABS 0.0 10/27/2012 0927   BASOSABS 0.0 10/27/2012 0927    BMET    Component Value Date/Time   NA 138 11/13/2015 1733   K 3.4 (L) 11/13/2015 1733   CL 106 11/13/2015 1733   CO2 22 11/13/2015 1733   GLUCOSE 85 11/13/2015 1733   BUN 10 11/13/2015 1733   CREATININE 0.85 11/13/2015 1733   CALCIUM 8.8 (L) 11/13/2015 1733   GFRNONAA >60 11/13/2015 1733   GFRAA >60 11/13/2015 1733    BNP No results found for: BNP  ProBNP No results found for: PROBNP  Imaging: No  results found.  Administration History     None           No data to display          No results found for: NITRICOXIDE   Assessment & Plan:  Phenix L. Yagi is a 33 y/o female with PMH of anxiety/depression and migraines who presents as a new patient for evaluation of sleep.  Excessive daytime sleepiness,  PND, snoring, and drowsiness while driving and an associated Epworth score of 13 are all concerning for sleep apnea. Assessment & Plan Excessive daytime sleepiness Plan for home sleep test to further evaluate. Follow sleep hygiene as discussed; informational packets for sleep apnea and sleep study were attached to today's AVS. Follow-up to review sleep study once it is completed.   Return in about 10 weeks (around 06/19/2024) for sleep study review.  Candis Dandy, PA-C 04/10/2024

## 2024-04-10 NOTE — Patient Instructions (Signed)
 Complete home sleep test as ordered.  Follow up in 10 weeks to review results.  Follow sleep hygiene as discussed.

## 2024-05-08 ENCOUNTER — Telehealth: Payer: Self-pay | Admitting: Primary Care

## 2024-05-08 NOTE — Telephone Encounter (Signed)
 Copied from CRM [id number]. Topic: Appointments - Appointment Cancel/Reschedule >> May 07, 2024  1:04 PM Essie A wrote: Patient/patient representative is calling to cancel or reschedule an appointment. Refer to attachments for appointment information. Patient is calling to reschedule a home sleep study scheduled for 05/08/2024 @ 10 am.  Please return patient's call to reschedule.  Thanks. >> May 08, 2024 11:38 AM Sherlean HERO wrote: 2x called pt but there was no response. Left voicemail for patient to callback to be rescheduled

## 2024-06-18 ENCOUNTER — Telehealth: Payer: Self-pay | Admitting: *Deleted

## 2024-06-18 NOTE — Telephone Encounter (Signed)
 Spoke with patient to inform her the [facility]  office is closed today 06/18/24 due to  maintenance issues and someone will call her tomorrow to reschedule her HST appointment ---she voiced her understanding

## 2024-06-19 ENCOUNTER — Ambulatory Visit (HOSPITAL_BASED_OUTPATIENT_CLINIC_OR_DEPARTMENT_OTHER): Payer: MEDICAID

## 2024-06-21 ENCOUNTER — Encounter (HOSPITAL_BASED_OUTPATIENT_CLINIC_OR_DEPARTMENT_OTHER): Payer: MEDICAID
